# Patient Record
Sex: Female | Born: 1970 | Race: Black or African American | Hispanic: No | Marital: Single | Smoking: Former smoker
Health system: Southern US, Community
[De-identification: ages and names within clinical notes are randomized; demographics above are authoritative.]

## PROBLEM LIST (undated history)

## (undated) DIAGNOSIS — G43909 Migraine, unspecified, not intractable, without status migrainosus: Secondary | ICD-10-CM

## (undated) DIAGNOSIS — I10 Essential (primary) hypertension: Secondary | ICD-10-CM

## (undated) HISTORY — PX: ABDOMINAL HYSTERECTOMY: SHX81

## (undated) HISTORY — PX: HERNIA REPAIR: SHX51

---

## 2002-05-29 ENCOUNTER — Encounter: Payer: Self-pay | Admitting: Emergency Medicine

## 2002-05-29 ENCOUNTER — Emergency Department (HOSPITAL_COMMUNITY): Admission: EM | Admit: 2002-05-29 | Discharge: 2002-05-29 | Payer: Self-pay | Admitting: Emergency Medicine

## 2002-08-27 ENCOUNTER — Ambulatory Visit (HOSPITAL_COMMUNITY): Admission: RE | Admit: 2002-08-27 | Discharge: 2002-08-27 | Payer: Self-pay | Admitting: *Deleted

## 2002-09-04 ENCOUNTER — Encounter: Payer: Self-pay | Admitting: *Deleted

## 2002-09-04 ENCOUNTER — Ambulatory Visit (HOSPITAL_COMMUNITY): Admission: RE | Admit: 2002-09-04 | Discharge: 2002-09-04 | Payer: Self-pay | Admitting: *Deleted

## 2002-12-18 ENCOUNTER — Ambulatory Visit (HOSPITAL_COMMUNITY): Admission: RE | Admit: 2002-12-18 | Discharge: 2002-12-18 | Payer: Self-pay | Admitting: General Surgery

## 2002-12-26 ENCOUNTER — Ambulatory Visit (HOSPITAL_COMMUNITY): Admission: RE | Admit: 2002-12-26 | Discharge: 2002-12-26 | Payer: Self-pay | Admitting: *Deleted

## 2003-03-14 ENCOUNTER — Inpatient Hospital Stay (HOSPITAL_COMMUNITY): Admission: RE | Admit: 2003-03-14 | Discharge: 2003-03-16 | Payer: Self-pay | Admitting: General Surgery

## 2003-05-05 ENCOUNTER — Ambulatory Visit (HOSPITAL_COMMUNITY): Admission: RE | Admit: 2003-05-05 | Discharge: 2003-05-05 | Payer: Self-pay | Admitting: General Surgery

## 2003-12-22 ENCOUNTER — Ambulatory Visit: Payer: Self-pay | Admitting: Nurse Practitioner

## 2004-03-19 ENCOUNTER — Emergency Department (HOSPITAL_COMMUNITY): Admission: EM | Admit: 2004-03-19 | Discharge: 2004-03-19 | Payer: Self-pay | Admitting: Emergency Medicine

## 2004-12-04 ENCOUNTER — Emergency Department (HOSPITAL_COMMUNITY): Admission: EM | Admit: 2004-12-04 | Discharge: 2004-12-04 | Payer: Self-pay | Admitting: Family Medicine

## 2004-12-22 ENCOUNTER — Emergency Department (HOSPITAL_COMMUNITY): Admission: EM | Admit: 2004-12-22 | Discharge: 2004-12-23 | Payer: Self-pay | Admitting: Emergency Medicine

## 2005-12-20 ENCOUNTER — Ambulatory Visit: Payer: Self-pay | Admitting: *Deleted

## 2005-12-20 ENCOUNTER — Ambulatory Visit: Payer: Self-pay | Admitting: Nurse Practitioner

## 2006-03-08 ENCOUNTER — Ambulatory Visit: Payer: Self-pay | Admitting: Nurse Practitioner

## 2006-04-05 ENCOUNTER — Ambulatory Visit: Payer: Self-pay | Admitting: Nurse Practitioner

## 2006-04-26 ENCOUNTER — Ambulatory Visit: Payer: Self-pay | Admitting: Nurse Practitioner

## 2006-05-02 ENCOUNTER — Ambulatory Visit (HOSPITAL_COMMUNITY): Admission: RE | Admit: 2006-05-02 | Discharge: 2006-05-02 | Payer: Self-pay | Admitting: Nurse Practitioner

## 2006-05-10 ENCOUNTER — Ambulatory Visit: Payer: Self-pay | Admitting: Nurse Practitioner

## 2006-05-12 ENCOUNTER — Ambulatory Visit (HOSPITAL_COMMUNITY): Admission: RE | Admit: 2006-05-12 | Discharge: 2006-05-12 | Payer: Self-pay | Admitting: Family Medicine

## 2006-06-09 ENCOUNTER — Ambulatory Visit: Payer: Self-pay | Admitting: Nurse Practitioner

## 2006-07-14 ENCOUNTER — Ambulatory Visit: Payer: Self-pay | Admitting: Nurse Practitioner

## 2006-10-26 ENCOUNTER — Ambulatory Visit: Payer: Self-pay | Admitting: Internal Medicine

## 2006-11-01 ENCOUNTER — Encounter (INDEPENDENT_AMBULATORY_CARE_PROVIDER_SITE_OTHER): Payer: Self-pay | Admitting: *Deleted

## 2007-01-16 ENCOUNTER — Ambulatory Visit: Payer: Self-pay | Admitting: Internal Medicine

## 2007-06-03 ENCOUNTER — Emergency Department (HOSPITAL_COMMUNITY): Admission: EM | Admit: 2007-06-03 | Discharge: 2007-06-03 | Payer: Self-pay | Admitting: Emergency Medicine

## 2007-07-04 ENCOUNTER — Ambulatory Visit: Payer: Self-pay | Admitting: Internal Medicine

## 2007-10-08 ENCOUNTER — Ambulatory Visit: Payer: Self-pay | Admitting: Family Medicine

## 2007-10-09 ENCOUNTER — Encounter: Payer: Self-pay | Admitting: Family Medicine

## 2008-01-15 ENCOUNTER — Emergency Department: Payer: Self-pay

## 2009-05-12 ENCOUNTER — Emergency Department (HOSPITAL_COMMUNITY): Admission: EM | Admit: 2009-05-12 | Discharge: 2009-05-12 | Payer: Self-pay | Admitting: Emergency Medicine

## 2010-01-21 ENCOUNTER — Emergency Department (HOSPITAL_COMMUNITY): Admission: EM | Admit: 2010-01-21 | Discharge: 2009-07-10 | Payer: Self-pay | Admitting: Emergency Medicine

## 2010-03-06 ENCOUNTER — Encounter: Payer: Self-pay | Admitting: General Surgery

## 2010-03-07 ENCOUNTER — Encounter: Payer: Self-pay | Admitting: General Surgery

## 2010-04-09 ENCOUNTER — Other Ambulatory Visit: Payer: Self-pay | Admitting: Family Medicine

## 2010-04-09 DIAGNOSIS — Z1231 Encounter for screening mammogram for malignant neoplasm of breast: Secondary | ICD-10-CM

## 2010-04-28 ENCOUNTER — Ambulatory Visit
Admission: RE | Admit: 2010-04-28 | Discharge: 2010-04-28 | Disposition: A | Discharge status: Home / Self Care | Payer: PRIVATE HEALTH INSURANCE | Source: Ambulatory Visit | Attending: Family Medicine | Admitting: Family Medicine

## 2010-04-28 DIAGNOSIS — Z1231 Encounter for screening mammogram for malignant neoplasm of breast: Secondary | ICD-10-CM

## 2010-04-30 ENCOUNTER — Other Ambulatory Visit: Payer: Self-pay | Admitting: Family Medicine

## 2010-04-30 DIAGNOSIS — R928 Other abnormal and inconclusive findings on diagnostic imaging of breast: Secondary | ICD-10-CM

## 2010-05-07 ENCOUNTER — Ambulatory Visit
Admission: RE | Admit: 2010-05-07 | Discharge: 2010-05-07 | Disposition: A | Discharge status: Home / Self Care | Payer: PRIVATE HEALTH INSURANCE | Source: Ambulatory Visit | Attending: Family Medicine | Admitting: Family Medicine

## 2010-05-07 DIAGNOSIS — R928 Other abnormal and inconclusive findings on diagnostic imaging of breast: Secondary | ICD-10-CM

## 2010-05-09 LAB — BASIC METABOLIC PANEL
BUN: 8 mg/dL (ref 6–23)
Chloride: 104 mEq/L (ref 96–112)
GFR calc non Af Amer: >60 mL/min (ref >60)
Potassium: 4 mEq/L (ref 3.5–5.1)
Sodium: 136 mEq/L (ref 135–145)

## 2010-05-09 LAB — URINALYSIS, ROUTINE W REFLEX MICROSCOPIC
Bilirubin Urine: NEGATIVE
Glucose, UA: NEGATIVE mg/dL
Hgb urine dipstick: NEGATIVE
Specific Gravity, Urine: 1.014 (ref 1.005–1.030)
Urobilinogen, UA: 0.2 mg/dL (ref 0.0–1.0)
pH: 7 (ref 5.0–8.0)

## 2010-05-09 LAB — DIFFERENTIAL
Eosinophils Absolute: 0.2 10*3/uL (ref 0.0–0.7)
Eosinophils Relative: 2 % (ref 0–5)
Lymphocytes Relative: 30 % (ref 12–46)
Lymphs Abs: 2.4 10*3/uL (ref 0.7–4.0)
Monocytes Absolute: 0.3 10*3/uL (ref 0.1–1.0)
Monocytes Relative: 4 % (ref 3–12)

## 2010-05-09 LAB — POCT CARDIAC MARKERS
CKMB, poc: 3.3 ng/mL (ref 1.0–8.0)
Myoglobin, poc: 95.4 ng/mL (ref 12–200)

## 2010-05-09 LAB — CBC
HCT: 42.9 % (ref 36.0–46.0)
Hemoglobin: 14.7 g/dL (ref 12.0–15.0)
MCV: 93.4 fL (ref 78.0–100.0)
WBC: 7.8 10*3/uL (ref 4.0–10.5)

## 2010-05-09 LAB — D-DIMER, QUANTITATIVE: D-Dimer, Quant: 0.22 ug/mL-FEU (ref 0.00–0.48)

## 2010-07-02 NOTE — Op Note (Signed)
NAME:  Mary [name], Mary [name]                        ACCOUNT NO.:  [id number]   MEDICAL RECORD NO.:  [id number]                   PATIENT TYPE:  AMB   LOCATION:  DAY                                  FACILITY:  [facility]   PHYSICIAN:  Angelia Mould. Derrell Lolling, M.D.             DATE OF BIRTH:  1970/04/17   DATE OF PROCEDURE:  03/13/2003  DATE OF DISCHARGE:                                 OPERATIVE REPORT   PREOPERATIVE DIAGNOSIS:  Gastroesophageal reflux disease.   POSTOPERATIVE DIAGNOSIS:  Gastroesophageal reflux disease.   OPERATION:  Laparoscopic Nissen fundoplication.   SURGEON:  Angelia Mould. Derrell Lolling, M.D.   FIRST ASSISTANT:  Sandria Bales. Ezzard Standing, M.D.   INDICATIONS FOR PROCEDURE:  This is a 40 year old black female whose had  problems with bloating, heartburn, and epigastric discomfort and  regurgitation of foul smelling gastric fluid into her mouth for some time.  She has a past history of alcohol and substance abuse but has apparently not  been using for some time now.  She has been taking proton pump inhibitors  twice daily but still has symptoms.  She has had an endoscopy which shows a  small hiatal hernia.  She previously had active gastritis but that seemed to  be resolved at the most recent endoscopy.  She had an upper GI series which  showed normal peristalsis, no hiatal hernia but several episodes of  gastroesophageal reflux.  She has had an ultrasound which shows no  gallstones.  She is brought to the operating room electively for antireflux  surgery.   TECHNIQUE:  Following the induction of general endotracheal anesthesia, the  patient's abdomen was prepped and draped in a sterile fashion.  The stomach  was emptied with an oral gastric tube. A short transverse incision was made  in the left paramedian position about 4-5 cm above the level of the  umbilicus. A 10 mm Optiview trocar was used and the zero degree scope was  inserted. We traversed the abdominal wall noting the abdominal wall  layers  entered the abdominal cavity atraumatically.  Insufflation to 15 mmHg was  performed.  We placed a video camera and surveyed the abdomen and pelvis. We  found that she had fairly extensive adhesions from the dome of the right  lobe of the liver to the right hemidiaphragm, some of these were taken down.  The left lobe of the liver looked normal.  The stomach and duodenum and  gallbladder looked normal. The small intestine and large intestine were  grossly normal.   Two 10 mm trocars were placed in the right upper quadrant.  A 10 mm trocar  was placed in the left subcostal region and a 5 mm trocar was placed in the  epigastric region. Through this 5 mm port, we placed a deformable liver  retractor to retract the left lobe of the liver away. The stomach was placed  on traction.  Using the harmonic scalpel,  we took down the gastrohepatic  omentum.  We then dissected some of the peritoneum off of the anterior  surface of the esophagus and then also over to the left.  We dissected the  right crus away from the esophagus with blunt dissection as well as using  the harmonic scalpel. We identified the posterior vagus nerve. We identified  the left crus as well.   We used the harmonic scalpel to take down the short gastric vessels and  completely freed up the fundus of the stomach.  We had one short gastric  vessel that bled after it was divided. This bleeding was very close to the  spleen and we simply controlled that with Surgicel gauze and pressure for  about five minutes and that was completely hemostatic at the completion of  the case. We really did not lose that much blood.  We completely freed the  fundus off of the left crus.  We placed a Penrose drain around the back of  the esophagus after creating a window. We used this to complete the  dissection and create the window.  We closed the right and left crura of the  diaphragm together with two interrupted sutures of #0 Surgidek. We  then  placed a 50 mm dilator which was a lighted dilator down through the  esophagus into the stomach. This seemed to fill the hiatus and we did not  feel that any further sutures on the crura were necessary.  We brought the  fundus around behind the esophagus from left to right. We then found a  suitable piece of fundus on the left, made sure that these were contiguous  pieces up high and found that we could create a fundoplication without any  tension whatsoever. A traditional Nissen fundoplication  was performed with  three interrupted sutures of #0 Surgidek placed 1 cm apart for about a 2.5  cm wrap.  The first suture was placed in the fundus on the left and then in  the anterior wall of the esophagus just to the right of the vagus nerve and  then in the fundus on the right and then the suture was tied and a metal  clip was placed. The next two sutures were placed in a similar fashion 1 cm  apart each suture taking a bite of the esophagus. Each suture was marked  with a metal clip.  At the completion of the case, everything looked fine.  We inspected the liver and the spleen and there was no bleeding.  We took  photographs of the completed wrap.  The patient tolerated the procedure  well.  At this point, we inspected all the trocar sites and all the trocars  were removed under direct vision.  There was no bleeding from the trocar  sites. The pneumoperitoneum was released. The skin incisions were closed  with subcuticular sutures of 4-0 Vicryl and Steri-Strips.  Clean bandages  were placed and the patient taken to the recovery room in stable condition.  Estimated blood loss was about 20 mL, complications none.  Sponge, needle  and instrument counts were correct.                                               Angelia Mould. Derrell Lolling, M.D.    HMI/MEDQ  D:  03/13/2003  T:  03/13/2003  Job:  [id number]  cc:   Althea Grimmer. Luther Parody, M.D.  [address]  [place]  [place] [address]  Fax:  [phone]   Pablo Lawrence. Philipp Deputy, M.D.  [phone]  [place] [address]  Fax: [phone]

## 2010-07-02 NOTE — Op Note (Signed)
   NAME:  [name], Mary R                        ACCOUNT NO.:  [id number]   MEDICAL RECORD NO.:  [id number]                   PATIENT TYPE:  AMB   LOCATION:  ENDO                                 FACILITY:  [facility]   PHYSICIAN:  Althea Grimmer. Luther Parody, M.D.            DATE OF BIRTH:  1971-02-07   DATE OF PROCEDURE:  DATE OF DISCHARGE:  08/27/2002                                 OPERATIVE REPORT   PROCEDURE:  1. Esophageal manometry.  2. Ambulatory pH probe testing.   INDICATIONS:  Heartburn, chest pain, reflux, cough, and known hiatal hernia  endoscopically in [place].  The patient had a 4 cm hiatal hernia reported.  On this manometric study, the lower esophageal sphincter was noted to be 39-  44 cm from the nares.  The lower esophageal sphincter pressure was normal at  29.7 mmHg.  There was appropriate relaxation with swallows.  The wet  swallows were peristalic with moderate pressures measured at 84-99 mmHg  distally.  Upper esophageal peristaltic measurements similarly were normal.   IMPRESSION:  Essentially normal esophageal manometry study.   AMBULATORY PH PROBE TESTING:  The patient apparently dislodged the pH probe;  thus it was only in place approximately 10 hours.  Nevertheless during this  10 hours, which was mostly upright, the patient had minimal proximal reflux  but had reflux in the distal probe for a total time of 23 minutes or 3.8% of  the time.  This gives her a significant distal channel Johnson/DeMeester of  27 which is significantly elevated over the normal being less than 22.  The  study is somewhat difficult to interpret since the patient was reportedly  taking Nexium and there is no symptom log accompanying these reflux  episodes.   IMPRESSION:  Probably significant distal esophageal reflux.  In light of the  reported hiatal hernia, the essentially normal manometry and the significant  reflux, the patient is likely a candidate for laparoscopic Nissen  fundoplication if her symptoms remain uncontrolled.                                               Althea Grimmer. Luther Parody, M.D.    PJS/MEDQ  D:  08/30/2002  T:  09/01/2002  Job:  [id number]   cc:   Tresa Endo L. Philipp Deputy, M.D.  [phone]  [place] [address]  Fax: [phone]

## 2011-07-14 ENCOUNTER — Emergency Department (HOSPITAL_COMMUNITY)
Admission: EM | Admit: 2011-07-14 | Discharge: 2011-07-14 | Disposition: A | Discharge status: Home / Self Care | Payer: Self-pay | Attending: Emergency Medicine | Admitting: Emergency Medicine

## 2011-07-14 ENCOUNTER — Encounter (HOSPITAL_COMMUNITY): Payer: Self-pay | Admitting: Emergency Medicine

## 2011-07-14 DIAGNOSIS — R05 Cough: Secondary | ICD-10-CM | POA: Insufficient documentation

## 2011-07-14 DIAGNOSIS — R5381 Other malaise: Secondary | ICD-10-CM | POA: Insufficient documentation

## 2011-07-14 DIAGNOSIS — R51 Headache: Secondary | ICD-10-CM | POA: Insufficient documentation

## 2011-07-14 DIAGNOSIS — R059 Cough, unspecified: Secondary | ICD-10-CM | POA: Insufficient documentation

## 2011-07-14 DIAGNOSIS — IMO0001 Reserved for inherently not codable concepts without codable children: Secondary | ICD-10-CM | POA: Insufficient documentation

## 2011-07-14 DIAGNOSIS — J020 Streptococcal pharyngitis: Secondary | ICD-10-CM | POA: Insufficient documentation

## 2011-07-14 DIAGNOSIS — R110 Nausea: Secondary | ICD-10-CM | POA: Insufficient documentation

## 2011-07-14 HISTORY — DX: Migraine, unspecified, not intractable, without status migrainosus: G43.909

## 2011-07-14 LAB — RAPID STREP SCREEN (MED CTR MEBANE ONLY): Streptococcus, Group A Screen (Direct): POSITIVE — AB

## 2011-07-14 MED ORDER — KETOROLAC TROMETHAMINE 60 MG/2ML IM SOLN
60.0000 mg | Freq: Once | INTRAMUSCULAR | Status: AC
  Administered 2011-07-14: 60 mg via INTRAMUSCULAR
  Filled 2011-07-14: qty 2

## 2011-07-14 MED ORDER — HYDROCODONE-ACETAMINOPHEN 7.5-500 MG/15ML PO SOLN: 15.0000 mL | Freq: Four times a day (QID) | ORAL | Status: AC | PRN

## 2011-07-14 MED ORDER — LIDOCAINE VISCOUS 2 % MT SOLN: 20.0000 mL | OROMUCOSAL | Status: AC | PRN

## 2011-07-14 MED ORDER — DEXAMETHASONE SODIUM PHOSPHATE 10 MG/ML IJ SOLN
10.0000 mg | Freq: Once | INTRAMUSCULAR | Status: AC
  Administered 2011-07-14: 10 mg via INTRAMUSCULAR
  Filled 2011-07-14: qty 1

## 2011-07-14 MED ORDER — PENICILLIN G BENZATHINE 1200000 UNIT/2ML IM SUSP
1.2000 10*6.[IU] | Freq: Once | INTRAMUSCULAR | Status: AC
  Administered 2011-07-14: 1.2 10*6.[IU] via INTRAMUSCULAR
  Filled 2011-07-14: qty 2

## 2011-07-14 NOTE — ED Notes (Signed)
Pt. Started sneezing about 5 days ago, then developed sore throat, headache, chest hurting, and nausea.  Pt. Has been in bed for two days.  She has been taking Robitussin, Dayquil, and cough drops.

## 2011-07-14 NOTE — Discharge Instructions (Signed)
Salt Water Gargle  This solution will help make your mouth and throat feel better.  HOME CARE INSTRUCTIONS     Mix 1 teaspoon of salt in 8 ounces of warm water.   Gargle with this solution as much or often as you need or as directed. Swish and gargle gently if you have any sores or wounds in your mouth.   Do not swallow this mixture.  Document Released: 11/05/2003 Document Revised: 01/20/2011 Document Reviewed: 03/28/2008  ExitCare Patient Information 2012 [employer]    Strep Throat  Strep throat is an infection of the throat caused by a bacteria named Streptococcus pyogenes. Your caregiver may call the infection streptococcal "tonsillitis" or "pharyngitis" depending on whether there are signs of inflammation in the tonsils or back of the throat. Strep throat is most common in children from 5 to 15 years old during the cold months of the year, but it can occur in people of any age during any season. This infection is spread from person to person (contagious) through coughing, sneezing, or other close contact.  SYMPTOMS     Fever or chills.   Painful, swollen, red tonsils or throat.   Pain or difficulty when swallowing.   White or yellow spots on the tonsils or throat.   Swollen, tender lymph nodes or "glands" of the neck or under the jaw.   Red rash all over the body (rare).  DIAGNOSIS    Many different infections can cause the same symptoms. A test must be done to confirm the diagnosis so the right treatment can be given. A "rapid strep test" can help your caregiver make the diagnosis in a few minutes. If this test is not available, a light swab of the infected area can be used for a throat culture test. If a throat culture test is done, results are usually available in a day or two.  TREATMENT    Strep throat is treated with antibiotic medicine.  HOME CARE INSTRUCTIONS     Gargle with 1 tsp of salt in 1 cup of warm water, 3 to 4 times per day or as needed for comfort.    Family members who also have a sore throat or fever should be tested for strep throat and treated with antibiotics if they have the strep infection.   Make sure everyone in your household washes their hands well.   Do not share food, drinking cups, or personal items that could cause the infection to spread to others.   You may need to eat a soft food diet until your sore throat gets better.   Drink enough water and fluids to keep your urine clear or pale yellow. This will help prevent dehydration.   Get plenty of rest.   Stay home from school, daycare, or work until you have been on antibiotics for 24 hours.   Only take over-the-counter or prescription medicines for pain, discomfort, or fever as directed by your caregiver.   If antibiotics are prescribed, take them as directed. Finish them even if you start to feel better.  SEEK MEDICAL CARE IF:     The glands in your neck continue to enlarge.   You develop a rash, cough, or earache.   You cough up green, yellow-brown, or bloody sputum.   You have pain or discomfort not controlled by medicines.   Your problems seem to be getting worse rather than better.  SEEK IMMEDIATE MEDICAL CARE IF:     You develop any new symptoms such   as vomiting, severe headache, stiff or painful neck, chest pain, shortness of breath, or trouble swallowing.   You develop severe throat pain, drooling, or changes in your voice.   You develop swelling of the neck, or the skin on the neck becomes red and tender.   You have a fever.   You develop signs of dehydration, such as fatigue, dry mouth, and decreased urination.   You become increasingly sleepy, or you cannot wake up completely.  Document Released: 01/29/2000 Document Revised: 01/20/2011 Document Reviewed: 04/01/2010  [employer] Patient Information 2012 [employer]

## 2011-07-14 NOTE — ED Notes (Signed)
Pt presented to the ER with multiple complaints, states that sx sr=tarted about 5 days ago, pt states it started as "my typical seasonal allergy irritations and went down the hill there after", pt further c/o sneezing, HA, chest discomfort and tightness, sore thorat.

## 2011-07-14 NOTE — ED Provider Notes (Signed)
History     CSN: [id number]  Arrival date & time 07/14/11  1651   First MD Initiated Contact with Patient 07/14/11 1702      5:40 PM HPI Patient reports for 5 days has had multiple symptoms of sore throat, headache, sneezing, coughing, nausea. Reports she's taken multiple over-the-counter medications but nothing is improved her symptoms. Reports painful to swallow. Reports productive cough. Denies chest pain, shortness of breath, neck pain, fevers  Patient is a 41 y.o. female presenting with URI. The history is limited by a developmental delay.  URI The primary symptoms include fatigue, headaches, sore throat, swollen glands, cough, nausea, myalgias and arthralgias. Primary symptoms do not include fever, ear pain, wheezing, abdominal pain, vomiting or rash. The current episode started 3 to 5 days ago. This is a new problem. The problem has not changed since onset. The headache is not associated with neck stiffness.  The swelling is not associated with neck pain.  Symptoms associated with the illness include chills, sinus pressure, congestion and rhinorrhea. The illness is not associated with plugged ear sensation or facial pain. The following treatments were addressed: A decongestant was ineffective.    Past Medical History  Diagnosis Date  . Migraines     Past Surgical History  Procedure Date  . Abdominal hysterectomy   . Hernia repair     No family history on file.  History  Substance Use Topics  . Smoking status: Current Some Day Smoker  . Smokeless tobacco: Not on file  . Alcohol Use: 1.8 oz/week    3 Glasses of wine per week    OB History    Grav Para Term Preterm Abortions TAB SAB Ect Mult Living                  Review of Systems  Constitutional: Positive for chills and fatigue. Negative for fever.  HENT: Positive for congestion, sore throat, rhinorrhea, sneezing, voice change and sinus pressure. Negative for ear pain, facial swelling, neck pain, neck stiffness  and postnasal drip.   Respiratory: Positive for cough. Negative for shortness of breath and wheezing.   Cardiovascular: Negative for chest pain.  Gastrointestinal: Positive for nausea. Negative for vomiting and abdominal pain.  Genitourinary: Negative for dysuria, urgency and frequency.  Musculoskeletal: Positive for myalgias and arthralgias.  Skin: Negative for rash.  Neurological: Positive for headaches.  All other systems reviewed and are negative.    Allergies  Aspirin  Home Medications   Current Outpatient Rx  Name Route Sig Dispense Refill  . IBUPROFEN 200 MG PO TABS Oral Take 400 mg by mouth every 6 (six) hours as needed.    Marland Kitchen ONDANSETRON HCL 4 MG PO TABS Oral Take 4 mg by mouth every 8 (eight) hours as needed. FOR NAUSEA AND VOMITING    . TRAMADOL HCL 50 MG PO TABS Oral Take 50 mg by mouth every 6 (six) hours as needed. PAIN      BP 123/76  Pulse 103  Temp(Src) 98.9 F (37.2 C) (Oral)  Resp 20  Wt 180 lb (81.647 kg)  SpO2 100%  Physical Exam  Vitals reviewed. Constitutional: She is oriented to person, place, and time. Vital signs are normal. She appears well-developed and well-nourished.  HENT:  Head: Normocephalic and atraumatic. No trismus in the jaw.  Right Ear: Tympanic membrane, external ear and ear canal normal.  Left Ear: Tympanic membrane, external ear and ear canal normal.  Nose: Nose normal. No mucosal edema or rhinorrhea. Right sinus  exhibits no maxillary sinus tenderness and no frontal sinus tenderness. Left sinus exhibits no maxillary sinus tenderness and no frontal sinus tenderness.  Mouth/Throat: Uvula is midline and mucous membranes are normal. Normal dentition. No dental abscesses or uvula swelling. Posterior oropharyngeal erythema present. No oropharyngeal exudate or posterior oropharyngeal edema.  Eyes: Conjunctivae and EOM are normal. Pupils are equal, round, and reactive to light. Right eye exhibits no discharge. Left eye exhibits no discharge.    Neck: Trachea normal and normal range of motion. Neck supple. No spinous process tenderness and no muscular tenderness present. No rigidity. Normal range of motion present.  Cardiovascular: Normal rate, regular rhythm and normal heart sounds.  Exam reveals no friction rub.   No murmur heard. Pulmonary/Chest: Effort normal and breath sounds normal. She has no wheezes. She has no rhonchi. She has no rales. She exhibits no tenderness.  Abdominal: Soft. She exhibits no distension. There is no tenderness. There is no rebound and no guarding.  Musculoskeletal: Normal range of motion.  Lymphadenopathy:    She has no cervical adenopathy.  Neurological: She is alert and oriented to person, place, and time. Coordination normal.  Skin: Skin is warm and dry. No rash noted. No erythema. No pallor.    ED Course  Procedures  Results for orders placed during the hospital encounter of 07/14/11  RAPID STREP SCREEN      Component Value Range   Streptococcus, Group A Screen (Direct) POSITIVE (*) NEGATIVE     MDM    Pt is positive for strep pharyngitis. Requests to have IM Bicillin. Will discharge with viscous lidocaine and Lortab elixir for pain. Patient voices understanding and is ready for discharge    Thomasene Lot, Cordelia Poche 07/14/11 1610

## 2011-07-21 NOTE — ED Provider Notes (Signed)
Medical screening examination/treatment/procedure(s) were performed by non-physician practitioner and as supervising physician I was immediately available for consultation/collaboration.  Maaliyah Adolph, MD 07/21/11 0239 

## 2012-02-07 ENCOUNTER — Encounter (HOSPITAL_COMMUNITY): Payer: Self-pay | Admitting: *Deleted

## 2012-02-07 ENCOUNTER — Emergency Department (HOSPITAL_COMMUNITY): Payer: Self-pay

## 2012-02-07 ENCOUNTER — Emergency Department (HOSPITAL_COMMUNITY)
Admission: EM | Admit: 2012-02-07 | Discharge: 2012-02-07 | Disposition: A | Discharge status: Home / Self Care | Payer: Self-pay | Attending: Emergency Medicine | Admitting: Emergency Medicine

## 2012-02-07 DIAGNOSIS — R059 Cough, unspecified: Secondary | ICD-10-CM | POA: Insufficient documentation

## 2012-02-07 DIAGNOSIS — R5381 Other malaise: Secondary | ICD-10-CM | POA: Insufficient documentation

## 2012-02-07 DIAGNOSIS — J029 Acute pharyngitis, unspecified: Secondary | ICD-10-CM | POA: Insufficient documentation

## 2012-02-07 DIAGNOSIS — R509 Fever, unspecified: Secondary | ICD-10-CM | POA: Insufficient documentation

## 2012-02-07 DIAGNOSIS — G43909 Migraine, unspecified, not intractable, without status migrainosus: Secondary | ICD-10-CM | POA: Insufficient documentation

## 2012-02-07 DIAGNOSIS — R51 Headache: Secondary | ICD-10-CM | POA: Insufficient documentation

## 2012-02-07 DIAGNOSIS — IMO0001 Reserved for inherently not codable concepts without codable children: Secondary | ICD-10-CM | POA: Insufficient documentation

## 2012-02-07 DIAGNOSIS — J069 Acute upper respiratory infection, unspecified: Secondary | ICD-10-CM | POA: Insufficient documentation

## 2012-02-07 DIAGNOSIS — R05 Cough: Secondary | ICD-10-CM | POA: Insufficient documentation

## 2012-02-07 DIAGNOSIS — R52 Pain, unspecified: Secondary | ICD-10-CM | POA: Insufficient documentation

## 2012-02-07 DIAGNOSIS — F17200 Nicotine dependence, unspecified, uncomplicated: Secondary | ICD-10-CM | POA: Insufficient documentation

## 2012-02-07 DIAGNOSIS — J3489 Other specified disorders of nose and nasal sinuses: Secondary | ICD-10-CM | POA: Insufficient documentation

## 2012-02-07 DIAGNOSIS — R0602 Shortness of breath: Secondary | ICD-10-CM | POA: Insufficient documentation

## 2012-02-07 MED ORDER — ONDANSETRON 4 MG PO TBDP
ORAL_TABLET | ORAL | Status: AC
  Administered 2012-02-07: 4 mg via ORAL
  Filled 2012-02-07: qty 1

## 2012-02-07 MED ORDER — KETOROLAC TROMETHAMINE 30 MG/ML IJ SOLN
30.0000 mg | Freq: Once | INTRAMUSCULAR | Status: AC
  Administered 2012-02-07: 30 mg via INTRAVENOUS
  Filled 2012-02-07: qty 1

## 2012-02-07 MED ORDER — HYDROCOD POLST-CHLORPHEN POLST 10-8 MG/5ML PO LQCR: 5.0000 mL | Freq: Two times a day (BID) | ORAL | Status: DC

## 2012-02-07 MED ORDER — ONDANSETRON 4 MG PO TBDP: 4.0000 mg | ORAL_TABLET | Freq: Once | ORAL | Status: DC

## 2012-02-07 MED ORDER — SODIUM CHLORIDE 0.9 % IV BOLUS (SEPSIS)
1000.0000 mL | Freq: Once | INTRAVENOUS | Status: AC
  Administered 2012-02-07: 1000 mL via INTRAVENOUS

## 2012-02-07 MED ORDER — BENZONATATE 100 MG PO CAPS: 100.0000 mg | ORAL_CAPSULE | Freq: Three times a day (TID) | ORAL | Status: DC

## 2012-02-07 MED ORDER — ONDANSETRON HCL 4 MG/2ML IJ SOLN
4.0000 mg | Freq: Once | INTRAMUSCULAR | Status: AC
  Administered 2012-02-07: 4 mg via INTRAVENOUS
  Filled 2012-02-07: qty 2

## 2012-02-07 MED ORDER — ONDANSETRON 4 MG PO TBDP
4.0000 mg | ORAL_TABLET | Freq: Once | ORAL | Status: AC
  Administered 2012-02-07: 4 mg via ORAL

## 2012-02-07 MED ORDER — BENZONATATE 100 MG PO CAPS
200.0000 mg | ORAL_CAPSULE | Freq: Once | ORAL | Status: AC
  Administered 2012-02-07: 200 mg via ORAL
  Filled 2012-02-07: qty 2

## 2012-02-07 NOTE — ED Provider Notes (Signed)
Medical screening examination/treatment/procedure(s) were performed by non-physician practitioner and as supervising physician I was immediately available for consultation/collaboration.   Kasaundra Fahrney, MD 02/07/12 0649 

## 2012-02-07 NOTE — ED Provider Notes (Signed)
History     CSN: [id number]  Arrival date & time 02/07/12  0016   First MD Initiated Contact with Patient 02/07/12 [phone]      Chief Complaint  Patient presents with  . Nausea  . Emesis  . Migraine  . Shortness of Breath    chest tightness.   HPI   history provided by the patient. Patient is a 41 year old female with prior history of migraines who presents with complaints of worsening cough, congestion and body aches symptoms. Patient reports starting to feel well over the past 3-4 days. Symptoms began to feel much worse yesterday with increasing cough and body aches. Patient also reports having increasing headache symptoms with sore throat from coughing. Symptoms have also been associated with some subjective fevers and chills and increased fatigue. Patient reports having one episode of nausea and vomiting. She denies any additional episodes and denies any diarrhea symptoms. Patient did use some TheraFlu medications last evening without any significant change. She reports having several family members who have been sick recently. Denies any other known sick contacts. Patient did not receive a flu shot this year.   Past Medical History  Diagnosis Date  . Migraines     Past Surgical History  Procedure Date  . Abdominal hysterectomy   . Hernia repair     History reviewed. No pertinent family history.  History  Substance Use Topics  . Smoking status: Current Some Day Smoker  . Smokeless tobacco: Not on file  . Alcohol Use: 1.8 oz/week    3 Glasses of wine per week    OB History    Grav Para Term Preterm Abortions TAB SAB Ect Mult Living                  Review of Systems  Constitutional: Positive for fever, chills and fatigue.  HENT: Positive for congestion and sore throat.   Respiratory: Positive for cough.   Cardiovascular: Negative for chest pain.  Gastrointestinal: Positive for nausea and vomiting. Negative for abdominal pain, diarrhea and constipation.   Musculoskeletal: Positive for myalgias.  Neurological: Positive for headaches.  All other systems reviewed and are negative.    Allergies  Aspirin  Home Medications   Current Outpatient Rx  Name  Route  Sig  Dispense  Refill  . IBUPROFEN 200 MG PO TABS   Oral   Take 400 mg by mouth every 6 (six) hours as needed.           BP 119/87  Pulse 73  Temp 99.5 F (37.5 C) (Oral)  Resp 16  SpO2 99%  Physical Exam  Nursing note and vitals reviewed. Constitutional: She is oriented to person, place, and time. She appears well-developed and well-nourished. No distress.  HENT:  Head: Normocephalic.  Right Ear: Tympanic membrane normal.  Left Ear: Tympanic membrane normal.  Mouth/Throat: Oropharynx is clear and moist.  Cardiovascular: Normal rate and regular rhythm.   Pulmonary/Chest: Effort normal and breath sounds normal. No respiratory distress. She has no wheezes. She has no rales.  Abdominal: Soft. There is no tenderness. There is no rebound and no guarding.  Musculoskeletal: Normal range of motion. She exhibits no edema and no tenderness.  Neurological: She is alert and oriented to person, place, and time.  Skin: Skin is warm and dry. No rash noted.  Psychiatric: She has a normal mood and affect. Her behavior is normal.    ED Course  Procedures      Dg Chest 2 View  02/07/2012  *RADIOLOGY REPORT*  Clinical Data: Shortness of breath, cough, chest pain  CHEST - 2 VIEW  Comparison: 05/12/2009  Findings: Lungs are clear. No pleural effusion or pneumothorax.  Cardiomediastinal silhouette is within normal limits.  Mild degenerative changes of the visualized thoracolumbar spine.  IMPRESSION: No evidence of acute cardiopulmonary disease.   Original Report Authenticated By: Charline Bills, M.D.      1. URI (upper respiratory infection)       MDM  2:35 AM patient seen and evaluated. Patient is well-appearing in no acute distress. Does not appear severely ill or toxic.  Normal respirations and O2 sats.        Angus Seller, [place] 02/07/12 [phone]

## 2012-02-07 NOTE — ED Notes (Signed)
Patient is alert and oriented x3.  She is complaining of shortness of breath with a migraine . She is also complaining of nausea and vomiting.  Currently she rates her pain 10 of 10.

## 2012-03-23 ENCOUNTER — Encounter (HOSPITAL_COMMUNITY): Payer: Self-pay

## 2012-03-23 ENCOUNTER — Emergency Department (HOSPITAL_COMMUNITY)
Admission: EM | Admit: 2012-03-23 | Discharge: 2012-03-23 | Disposition: A | Discharge status: Home / Self Care | Payer: Self-pay | Attending: Emergency Medicine | Admitting: Emergency Medicine

## 2012-03-23 DIAGNOSIS — Z90710 Acquired absence of both cervix and uterus: Secondary | ICD-10-CM | POA: Insufficient documentation

## 2012-03-23 DIAGNOSIS — R197 Diarrhea, unspecified: Secondary | ICD-10-CM | POA: Insufficient documentation

## 2012-03-23 DIAGNOSIS — R109 Unspecified abdominal pain: Secondary | ICD-10-CM | POA: Insufficient documentation

## 2012-03-23 DIAGNOSIS — Z8679 Personal history of other diseases of the circulatory system: Secondary | ICD-10-CM | POA: Insufficient documentation

## 2012-03-23 DIAGNOSIS — F17200 Nicotine dependence, unspecified, uncomplicated: Secondary | ICD-10-CM | POA: Insufficient documentation

## 2012-03-23 DIAGNOSIS — R112 Nausea with vomiting, unspecified: Secondary | ICD-10-CM | POA: Insufficient documentation

## 2012-03-23 LAB — CBC WITH DIFFERENTIAL/PLATELET
Basophils Relative: 0 % (ref 0–1)
Eosinophils Relative: 1 % (ref 0–5)
HCT: 41.1 % (ref 36.0–46.0)
Hemoglobin: 14.3 g/dL (ref 12.0–15.0)
Lymphocytes Relative: 8 % — ABNORMAL LOW (ref 12–46)
MCHC: 34.8 g/dL (ref 30.0–36.0)
MCV: 94.1 fL (ref 78.0–100.0)
Monocytes Absolute: 0.2 10*3/uL (ref 0.1–1.0)
Monocytes Relative: 2 % — ABNORMAL LOW (ref 3–12)
Neutro Abs: 8.2 10*3/uL — ABNORMAL HIGH (ref 1.7–7.7)

## 2012-03-23 LAB — URINALYSIS, ROUTINE W REFLEX MICROSCOPIC
Bilirubin Urine: NEGATIVE
Hgb urine dipstick: NEGATIVE
Specific Gravity, Urine: 1.031 — ABNORMAL HIGH (ref 1.005–1.030)
pH: 5.5 (ref 5.0–8.0)

## 2012-03-23 LAB — BASIC METABOLIC PANEL
BUN: 12 mg/dL (ref 6–23)
CO2: 23 mEq/L (ref 19–32)
Chloride: 102 mEq/L (ref 96–112)
Creatinine, Ser: 0.86 mg/dL (ref 0.50–1.10)

## 2012-03-23 MED ORDER — PROMETHAZINE HCL 25 MG PO TABS: 25.0000 mg | ORAL_TABLET | Freq: Four times a day (QID) | ORAL | Status: DC | PRN

## 2012-03-23 MED ORDER — SODIUM CHLORIDE 0.9 % IV BOLUS (SEPSIS)
1000.0000 mL | Freq: Once | INTRAVENOUS | Status: AC
  Administered 2012-03-23: 1000 mL via INTRAVENOUS

## 2012-03-23 MED ORDER — DICYCLOMINE HCL 10 MG PO CAPS
10.0000 mg | ORAL_CAPSULE | Freq: Once | ORAL | Status: AC
  Administered 2012-03-23: 10 mg via ORAL
  Filled 2012-03-23: qty 1

## 2012-03-23 MED ORDER — DICYCLOMINE HCL 20 MG PO TABS: 20.0000 mg | ORAL_TABLET | Freq: Two times a day (BID) | ORAL | Status: DC

## 2012-03-23 MED ORDER — ONDANSETRON HCL 4 MG/2ML IJ SOLN
4.0000 mg | Freq: Once | INTRAMUSCULAR | Status: AC
  Administered 2012-03-23: 4 mg via INTRAVENOUS
  Filled 2012-03-23: qty 2

## 2012-03-23 NOTE — ED Notes (Signed)
Pt given a cup of ice with Sprite

## 2012-03-23 NOTE — ED Notes (Signed)
Pt given 3 warm blankets.

## 2012-03-23 NOTE — ED Notes (Signed)
Pt presents with onset of umbilical abdominal pain that woke her from sleep today at 0300.  Pt describes pain as a cramping, reports pain is now generalized.  +nausea, vomiting and diarrhea

## 2012-03-23 NOTE — ED Notes (Signed)
Pt up ambulatory to the bathroom at this time attempting to provide an urine specimen 

## 2012-03-23 NOTE — ED Notes (Signed)
Pt given another cup of ice

## 2012-03-23 NOTE — ED Provider Notes (Signed)
History     CSN: [id number]  Arrival date & time 03/23/12  1326   First MD Initiated Contact with Patient 03/23/12 1535      No chief complaint on file.   (Consider location/radiation/quality/duration/timing/severity/associated sxs/prior treatment) HPI Comments: Patient reports that she woke up this morning with nausea, vomiting, and diarrhea.  She has had two episodes of vomiting and three episodes of diarrhea.  No blood in her emesis or blood in her stool.  She has also had diffuse abdominal cramping pain associated with her symptoms.  She reports that the abdominal pain has been coming in spasms.  She has a history of IBS and reports that her abdominal cramping feels similar to the pain that she has had with IBS.  She reports that today she has had episodes where she has solid bowel movements and also episodes of diarrhea.  She denies fever or chills.  No urinary symptoms.   Abdominal surgeries include a hernia repair and an abdominal hysterectomy.    The history is provided by the patient.    Past Medical History  Diagnosis Date  . Migraines     Past Surgical History  Procedure Date  . Abdominal hysterectomy   . Hernia repair     History reviewed. No pertinent family history.  History  Substance Use Topics  . Smoking status: Current Some Day Smoker  . Smokeless tobacco: Not on file  . Alcohol Use: 1.8 oz/week    3 Glasses of wine per week    OB History    Grav Para Term Preterm Abortions TAB SAB Ect Mult Living                  Review of Systems  Constitutional: Negative for fever and chills.  Gastrointestinal: Positive for nausea, vomiting, abdominal pain and diarrhea. Negative for constipation, blood in stool and abdominal distention.  Genitourinary: Negative for dysuria, urgency and frequency.  All other systems reviewed and are negative.    Allergies  Aspirin  Home Medications  No current outpatient prescriptions on file.  BP 105/63  Pulse 84  Temp  98.4 F (36.9 C) (Oral)  Resp 16  SpO2 100%  Physical Exam  Nursing note and vitals reviewed. Constitutional: She appears well-developed and well-nourished. No distress.  HENT:  Head: Normocephalic and atraumatic.  Mouth/Throat: Oropharynx is clear and moist.  Neck: Normal range of motion. Neck supple.  Cardiovascular: Normal rate, regular rhythm and normal heart sounds.   Pulmonary/Chest: Effort normal and breath sounds normal.  Abdominal: Soft. Normal appearance and bowel sounds are normal. She exhibits no distension and no mass. There is generalized tenderness. There is no rebound and no guarding.  Musculoskeletal: Normal range of motion.  Neurological: She is alert.  Skin: Skin is warm and dry. She is not diaphoretic.  Psychiatric: She has a normal mood and affect.    ED Course  Procedures (including critical care time)  Labs Reviewed  CBC WITH DIFFERENTIAL - Abnormal; Notable for the following:    Neutrophils Relative 89 (*)     Neutro Abs 8.2 (*)     Lymphocytes Relative 8 (*)     Monocytes Relative 2 (*)     All other components within normal limits  BASIC METABOLIC PANEL - Abnormal; Notable for the following:    Glucose, Bld 101 (*)     GFR calc non Af Amer 82 (*)     All other components within normal limits  LIPASE, BLOOD  URINALYSIS, ROUTINE  W REFLEX MICROSCOPIC   No results found.   No diagnosis found.  5:27 PM Reassessed patient.  She reports that her abdominal pain has improved somewhat.  Nausea has also improved somewhat.  Patient currently receiving IVF.  Will fluid challenge and reassess.  6:00 PM Reassessed patient.  She reports that her pain and nausea have improved.  Patient able to tolerate PO liquids.  MDM  Patient presenting with nausea, vomiting, and diarrhea along with diffuse cramping abdominal pain.  No rebound or guarding on physical exam.  Patient afebrile.  Labs unremarkable.  Patient given Zofran and symptoms improved.  She was able to  tolerate PO liquids.  Patient discharged home.  Return precautions discussed.        Pascal Lux [place], PA-C 03/24/12 1627

## 2012-03-24 NOTE — ED Provider Notes (Signed)
  Medical screening examination/treatment/procedure(s) were performed by non-physician practitioner and as supervising physician I was immediately available for consultation/collaboration.    Gerhard Munch, MD 03/24/12 2329

## 2012-05-23 ENCOUNTER — Encounter (HOSPITAL_COMMUNITY): Payer: Self-pay | Admitting: Family Medicine

## 2012-05-23 ENCOUNTER — Inpatient Hospital Stay (HOSPITAL_COMMUNITY)
Admission: EM | Admit: 2012-05-23 | Discharge: 2012-05-27 | DRG: 392 | Disposition: A | Discharge status: Home / Self Care | Payer: MEDICAID | Attending: Internal Medicine | Admitting: Internal Medicine

## 2012-05-23 ENCOUNTER — Observation Stay (HOSPITAL_COMMUNITY): Payer: Self-pay

## 2012-05-23 DIAGNOSIS — K589 Irritable bowel syndrome without diarrhea: Secondary | ICD-10-CM | POA: Diagnosis present

## 2012-05-23 DIAGNOSIS — K625 Hemorrhage of anus and rectum: Secondary | ICD-10-CM | POA: Diagnosis present

## 2012-05-23 DIAGNOSIS — E860 Dehydration: Secondary | ICD-10-CM | POA: Diagnosis present

## 2012-05-23 DIAGNOSIS — K7689 Other specified diseases of liver: Secondary | ICD-10-CM | POA: Diagnosis present

## 2012-05-23 DIAGNOSIS — F17200 Nicotine dependence, unspecified, uncomplicated: Secondary | ICD-10-CM | POA: Diagnosis present

## 2012-05-23 DIAGNOSIS — R112 Nausea with vomiting, unspecified: Secondary | ICD-10-CM

## 2012-05-23 DIAGNOSIS — R109 Unspecified abdominal pain: Secondary | ICD-10-CM | POA: Diagnosis present

## 2012-05-23 DIAGNOSIS — K5289 Other specified noninfective gastroenteritis and colitis: Principal | ICD-10-CM | POA: Diagnosis present

## 2012-05-23 DIAGNOSIS — R52 Pain, unspecified: Secondary | ICD-10-CM

## 2012-05-23 LAB — CBC
Hemoglobin: 14.4 g/dL (ref 12.0–15.0)
MCH: 32.1 pg (ref 26.0–34.0)
RBC: 4.49 MIL/uL (ref 3.87–5.11)
WBC: 10 10*3/uL (ref 4.0–10.5)

## 2012-05-23 LAB — COMPREHENSIVE METABOLIC PANEL
ALT: 22 U/L (ref 0–35)
AST: 24 U/L (ref 0–37)
Alkaline Phosphatase: 85 U/L (ref 39–117)
CO2: 28 mEq/L (ref 19–32)
Calcium: 10.5 mg/dL (ref 8.4–10.5)
Chloride: 99 mEq/L (ref 96–112)
GFR calc Af Amer: 88 mL/min — ABNORMAL LOW (ref >90)
GFR calc non Af Amer: 76 mL/min — ABNORMAL LOW (ref >90)
Glucose, Bld: 92 mg/dL (ref 70–99)
Sodium: 136 mEq/L (ref 135–145)
Total Bilirubin: 0.6 mg/dL (ref 0.3–1.2)

## 2012-05-23 LAB — SEDIMENTATION RATE: Sed Rate: 4 mm/hr (ref 0–22)

## 2012-05-23 LAB — TYPE AND SCREEN

## 2012-05-23 LAB — LACTIC ACID, PLASMA: Lactic Acid, Venous: 1.2 mmol/L (ref 0.5–2.2)

## 2012-05-23 MED ORDER — ACETAMINOPHEN 325 MG PO TABS: 650.0000 mg | ORAL_TABLET | Freq: Four times a day (QID) | ORAL | Status: DC | PRN

## 2012-05-23 MED ORDER — ONDANSETRON HCL 4 MG/2ML IJ SOLN
4.0000 mg | Freq: Four times a day (QID) | INTRAMUSCULAR | Status: DC | PRN
  Administered 2012-05-25: 4 mg via INTRAVENOUS
  Filled 2012-05-23: qty 2

## 2012-05-23 MED ORDER — HYDROMORPHONE HCL PF 1 MG/ML IJ SOLN
1.0000 mg | Freq: Once | INTRAMUSCULAR | Status: AC
  Administered 2012-05-23: 1 mg via INTRAVENOUS
  Filled 2012-05-23: qty 1

## 2012-05-23 MED ORDER — ALUM & MAG HYDROXIDE-SIMETH 200-200-20 MG/5ML PO SUSP: 30.0000 mL | Freq: Four times a day (QID) | ORAL | Status: DC | PRN

## 2012-05-23 MED ORDER — IOHEXOL 300 MG/ML  SOLN
50.0000 mL | Freq: Once | INTRAMUSCULAR | Status: AC | PRN
  Administered 2012-05-23: 50 mL via ORAL

## 2012-05-23 MED ORDER — PANTOPRAZOLE SODIUM 40 MG IV SOLR
40.0000 mg | Freq: Two times a day (BID) | INTRAVENOUS | Status: DC
  Administered 2012-05-23 – 2012-05-26 (×6): 40 mg via INTRAVENOUS
  Filled 2012-05-23 (×7): qty 40

## 2012-05-23 MED ORDER — ONDANSETRON HCL 4 MG PO TABS
4.0000 mg | ORAL_TABLET | Freq: Four times a day (QID) | ORAL | Status: DC | PRN
  Administered 2012-05-26 – 2012-05-27 (×2): 4 mg via ORAL
  Filled 2012-05-23 (×2): qty 1

## 2012-05-23 MED ORDER — ONDANSETRON HCL 4 MG/2ML IJ SOLN
4.0000 mg | Freq: Once | INTRAMUSCULAR | Status: AC
  Administered 2012-05-23: 4 mg via INTRAVENOUS
  Filled 2012-05-23: qty 2

## 2012-05-23 MED ORDER — SODIUM CHLORIDE 0.9 % IV SOLN
INTRAVENOUS | Status: DC
  Administered 2012-05-23 – 2012-05-24 (×3): via INTRAVENOUS

## 2012-05-23 MED ORDER — PANTOPRAZOLE SODIUM 40 MG IV SOLR
40.0000 mg | Freq: Once | INTRAVENOUS | Status: AC
  Administered 2012-05-23: 40 mg via INTRAVENOUS
  Filled 2012-05-23: qty 40

## 2012-05-23 MED ORDER — METOCLOPRAMIDE HCL 5 MG/ML IJ SOLN
10.0000 mg | Freq: Once | INTRAMUSCULAR | Status: AC
  Administered 2012-05-23: 10 mg via INTRAVENOUS
  Filled 2012-05-23: qty 2

## 2012-05-23 MED ORDER — SODIUM CHLORIDE 0.9 % IJ SOLN
3.0000 mL | Freq: Two times a day (BID) | INTRAMUSCULAR | Status: DC
  Administered 2012-05-23 – 2012-05-26 (×4): 3 mL via INTRAVENOUS

## 2012-05-23 MED ORDER — SODIUM CHLORIDE 0.9 % IV SOLN
INTRAVENOUS | Status: DC
  Administered 2012-05-23: 19:00:00 via INTRAVENOUS

## 2012-05-23 MED ORDER — HYDROCODONE-ACETAMINOPHEN 5-325 MG PO TABS
1.0000 | ORAL_TABLET | ORAL | Status: DC | PRN
  Administered 2012-05-23: 1 via ORAL
  Administered 2012-05-24 – 2012-05-26 (×4): 2 via ORAL
  Filled 2012-05-23 (×6): qty 2

## 2012-05-23 MED ORDER — ACETAMINOPHEN 650 MG RE SUPP: 650.0000 mg | Freq: Four times a day (QID) | RECTAL | Status: DC | PRN

## 2012-05-23 MED ORDER — IOHEXOL 300 MG/ML  SOLN
100.0000 mL | Freq: Once | INTRAMUSCULAR | Status: AC | PRN
  Administered 2012-05-23: 100 mL via INTRAVENOUS

## 2012-05-23 MED ORDER — HYDROMORPHONE HCL PF 1 MG/ML IJ SOLN
1.0000 mg | INTRAMUSCULAR | Status: DC | PRN
  Administered 2012-05-24 – 2012-05-26 (×7): 1 mg via INTRAVENOUS
  Filled 2012-05-23 (×8): qty 1

## 2012-05-23 NOTE — ED Notes (Signed)
Per pt sts after BM yesterday she had some bleeding and tissues. sts since she has been leaking out blood, abdominal pain and HA

## 2012-05-23 NOTE — ED Notes (Signed)
Pt returned from CT °

## 2012-05-23 NOTE — ED Notes (Signed)
Attempted to gain IV access, unsuccessful X2. Paged IV team.

## 2012-05-23 NOTE — ED Notes (Signed)
Pt finished drinking 1st cup of oral contrast, CT notified. 

## 2012-05-23 NOTE — H&P (Signed)
History and Physical       Hospital Admission Note Date: 05/23/2012  Patient name: Mary [name] Medical record number: [id number] Date of birth: [birth year] Age: 42 y.o. Gender: female PCP: Default, Provider, MD    Chief Complaint:  Crampy abdominal pain with the nausea, vomiting and rectal bleeding since last night  HPI: Patient is a 42 year old female with a history of hiatal hernia status post repair, possible IBS who presented with rectal bleeding that started with crampy abdominal pain last night. Patient states that she had diffuse abdominal pain, crampy in nature last night and in when she used the toilet she had bright red blood. She also has nausea and vomiting and unable to hold anything down. She had 7 episodes of rectal bleeding since last night. She feels generally weak but no dizziness lightheadedness syncope any chest pain.  Patient also states that she usually has crampy abdominal pain before having bowel movement for last several years but no prior bleeding. She denies any NSAID use.   Review of Systems:  Constitutional: Denies fever, chills, diaphoresis, appetite change and fatigue.  HEENT: Denies photophobia, eye pain, redness, hearing loss, ear pain, congestion, sore throat, rhinorrhea, sneezing, mouth sores, trouble swallowing, neck pain, neck stiffness and tinnitus.   Respiratory: Denies SOB, DOE, cough, chest tightness,  and wheezing.   Cardiovascular: Denies chest pain, palpitations and leg swelling.  Gastrointestinal: Please see history of present illness  Genitourinary: Denies dysuria, urgency, frequency, hematuria, flank pain and difficulty urinating.  Musculoskeletal: Denies myalgias, back pain, joint swelling, arthralgias and gait problem.  Skin: Denies pallor, rash and wound.  Neurological: Denies dizziness, seizures, syncope,  light-headedness, numbness and headaches, generalized weakness.  Hematological:  Denies adenopathy. Easy bruising, personal or family bleeding history  Psychiatric/Behavioral: Denies suicidal ideation, mood changes, confusion, nervousness, sleep disturbance and agitation  Past Medical History: Past Medical History  Diagnosis Date  . Migraines    Past Surgical History  Procedure Laterality Date  . Abdominal hysterectomy    . Hernia repair      Medications: Prior to Admission medications   Not on File    Allergies:   Allergies  Allergen Reactions  . Aspirin Hives    Social History:  reports that she has been smoking.  She does not have any smokeless tobacco history on file. She reports that she drinks about 1.8 ounces of alcohol per week. Her drug history is not on file.  Family History: History reviewed. No pertinent family history.  Physical Exam: Blood pressure 106/72, pulse 57, temperature 98 F (36.7 C), temperature source Oral, resp. rate 20, SpO2 100.00%. General: Alert, awake, oriented x3, in no acute distress. HEENT: normocephalic, atraumatic, anicteric sclera, pink conjunctiva, pupils equal and reactive to light and accomodation, oropharynx clear Neck: supple, no masses or lymphadenopathy, no goiter, no bruits  Heart: Regular rate and rhythm, without murmurs, rubs or gallops. Lungs: Clear to auscultation bilaterally, no wheezing, rales or rhonchi. Abdomen: Mild Diffuse abdominal tenderness, soft no rebound or guarding, normal bowel sounds  Extremities: No clubbing, cyanosis or edema with positive pedal pulses. Neuro: Grossly intact, no focal neurological deficits, strength 5/5 upper and lower extremities bilaterally Psych: alert and oriented x 3, normal mood and affect Skin: no rashes or lesions, warm and dry   LABS on Admission:  Basic Metabolic Panel:  Recent Labs Lab 05/23/12 1611  NA 136  K 4.2  CL 99  CO2 28  GLUCOSE 92  BUN 9  CREATININE 0.91  CALCIUM 10.5  Liver Function Tests:  Recent Labs Lab 05/23/12 1611  AST  24  ALT 22  ALKPHOS 85  BILITOT 0.6  PROT 8.3  ALBUMIN 4.5   No results found for this basename: LIPASE, AMYLASE,  in the last 168 hours No results found for this basename: AMMONIA,  in the last 168 hours CBC:  Recent Labs Lab 05/23/12 1611  WBC 10.0  HGB 14.4  HCT 40.8  MCV 90.9  PLT 274     Radiological Exams on Admission: No results found.  Assessment/Plan Principal Problem:   Abdominal pain, acute with rectal bleeding, nausea and vomiting - Admit to telemetry, placed on IV fluid hydration, clears for now, antiemetics, pain control, IV protonix - Stat CT abdomen and pelvis to rule out any colitis (infectious versus inflammatory). If patient has any infectious colitis, will place on IV antibiotics. Obtain ESR, lactic acid. -  EDP (Dr Rosalia Hammers) to call GI consult  Active Problems:    Dehydration - Continue IV fluid hydration  DVT prophylaxis: SCDs  CODE STATUS: Full code  Further plan will depend as patient's clinical course evolves and further radiologic and laboratory data become available.   Time Spent on Admission: 1 hour  Umeka Wrench M.D. [facility] 05/23/2012, 7:17 PM Pager: [phone]  If 7PM-7AM, please contact night-coverage [email or web address] Password [email or web address]

## 2012-05-23 NOTE — ED Provider Notes (Addendum)
History     CSN: [id number]  Arrival date & time 05/23/12  1537   First MD Initiated Contact with Patient 05/23/12 1604      Chief Complaint  Patient presents with  . Rectal Bleeding    (Consider location/radiation/quality/duration/timing/severity/associated sxs/prior treatment) HPI Patient complaining of rectal bleeding today multiple episodes of loose stool described as bloody.  She has some diffuse crampy abdominal pain which she identifies as like her ibs.  She denies any history of rectal bleeding, ulcers, aspirin, nsaid use.  She has had a hiatal hernia repair and hysterectomy.  She feels generally weak but denies syncope, chest pain, or sob.  She does not currently have primary care.   Past Medical History  Diagnosis Date  . Migraines     Past Surgical History  Procedure Laterality Date  . Abdominal hysterectomy    . Hernia repair      History reviewed. No pertinent family history.  History  Substance Use Topics  . Smoking status: Current Some Day Smoker  . Smokeless tobacco: Not on file  . Alcohol Use: 1.8 oz/week    3 Glasses of wine per week    OB History   Grav Para Term Preterm Abortions TAB SAB Ect Mult Living                  Review of Systems  All other systems reviewed and are negative.    Allergies  Aspirin  Home Medications  No current outpatient prescriptions on file.  BP 106/72  Pulse 57  Temp(Src) 98 F (36.7 C) (Oral)  Resp 20  SpO2 100%  Physical Exam  Nursing note and vitals reviewed. Constitutional: She is oriented to person, place, and time. She appears well-developed and well-nourished.  HENT:  Head: Normocephalic and atraumatic.  Right Ear: External ear normal.  Left Ear: External ear normal.  Nose: Nose normal.  Mouth/Throat: Oropharynx is clear and moist.  Eyes: Conjunctivae and EOM are normal. Pupils are equal, round, and reactive to light.  Neck: Normal range of motion. Neck supple.  Cardiovascular: Normal rate,  regular rhythm, normal heart sounds and intact distal pulses.   Pulmonary/Chest: She is in respiratory distress.  Abdominal: Soft. Bowel sounds are normal. She exhibits no mass. There is no rebound and no guarding.  Mild diffuse tenderness  Genitourinary:  Patient with grossly bloody stool on rectal exam, nontender.   Musculoskeletal: Normal range of motion.  Neurological: She is alert and oriented to person, place, and time. She has normal reflexes.  Skin: Skin is warm and dry. No erythema.  Psychiatric: She has a normal mood and affect. Her behavior is normal. Judgment and thought content normal.    ED Course  Procedures (including critical care time)  Labs Reviewed  COMPREHENSIVE METABOLIC PANEL - Abnormal; Notable for the following:    GFR calc non Af Amer 76 (*)    GFR calc Af Amer 88 (*)    All other components within normal limits  CBC  TYPE AND SCREEN  ABO/RH   No results found.   No diagnosis found.  Results for orders placed during the hospital encounter of 05/23/12  CBC      Result Value Range   WBC 10.0  4.0 - 10.5 K/uL   RBC 4.49  3.87 - 5.11 MIL/uL   Hemoglobin 14.4  12.0 - 15.0 g/dL   HCT 40.9  81.1 - 91.4 %   MCV 90.9  78.0 - 100.0 fL   MCH 32.1  26.0 - 34.0 pg   MCHC 35.3  30.0 - 36.0 g/dL   RDW 16.1  09.6 - 04.5 %   Platelets 274  150 - 400 K/uL  COMPREHENSIVE METABOLIC PANEL      Result Value Range   Sodium 136  135 - 145 mEq/L   Potassium 4.2  3.5 - 5.1 mEq/L   Chloride 99  96 - 112 mEq/L   CO2 28  19 - 32 mEq/L   Glucose, Bld 92  70 - 99 mg/dL   BUN 9  6 - 23 mg/dL   Creatinine, Ser 4.09  0.50 - 1.10 mg/dL   Calcium 81.1  8.4 - 91.4 mg/dL   Total Protein 8.3  6.0 - 8.3 g/dL   Albumin 4.5  3.5 - 5.2 g/dL   AST 24  0 - 37 U/L   ALT 22  0 - 35 U/L   Alkaline Phosphatase 85  39 - 117 U/L   Total Bilirubin 0.6  0.3 - 1.2 mg/dL   GFR calc non Af Amer 76 (*) >90 mL/min   GFR calc Af Amer 88 (*) >90 mL/min  TYPE AND SCREEN      Result Value  Range   ABO/RH(D) O POS     Antibody Screen NEG     Sample Expiration 05/26/2012    ABO/RH      Result Value Range   ABO/RH(D) O POS       MDM  Patient who presents with multiple episodes of rectal bleeding today. She is hemodynamically stable. She has mild diffuse abdominal tenderness. She has a history of IBS. Hemoglobin is normal. Plan admission for rectal bleeding     Hilario Quarry, MD 05/23/12 1829  Discussed with Dr. Isidoro Donning.  Patient to be admitted to tele bed.  Consult to Gi placed.   Hilario Quarry, MD 05/23/12 1901  Discussed with Dr. Andrey Campanile on call for gi and he will see patient in consult.   Hilario Quarry, MD 05/23/12 2000

## 2012-05-23 NOTE — ED Notes (Signed)
Lab at bedside

## 2012-05-23 NOTE — ED Notes (Signed)
Pt reports around midnight her stomach started to hurt, around 3am she started to hear rumbling and got abd cramps. Pt reports she went to use the restroom and she noticed bld in the toliet, pt reports it has gotten lighter since this morning but still continuing to have bld her in stool. Pt reports she is usually constipated and only goes every 7-9 days, pt denies having a rectal bleed before. Pt denies being on any bld thinners. Pt ambulated to room with no issues, in nad, skin warm and dry, resp e/u.

## 2012-05-24 LAB — CBC
HCT: 36.4 % (ref 36.0–46.0)
Hemoglobin: 12.6 g/dL (ref 12.0–15.0)
MCV: 91 fL (ref 78.0–100.0)
RDW: 13.9 % (ref 11.5–15.5)
WBC: 8.5 10*3/uL (ref 4.0–10.5)

## 2012-05-24 LAB — BASIC METABOLIC PANEL
CO2: 26 mEq/L (ref 19–32)
Chloride: 105 mEq/L (ref 96–112)
Creatinine, Ser: 1.01 mg/dL (ref 0.50–1.10)
GFR calc Af Amer: 78 mL/min — ABNORMAL LOW (ref >90)
Potassium: 4.2 mEq/L (ref 3.5–5.1)

## 2012-05-24 MED ORDER — CIPROFLOXACIN IN D5W 400 MG/200ML IV SOLN
400.0000 mg | Freq: Two times a day (BID) | INTRAVENOUS | Status: DC
  Administered 2012-05-24 – 2012-05-25 (×4): 400 mg via INTRAVENOUS
  Filled 2012-05-24 (×5): qty 200

## 2012-05-24 MED ORDER — DIPHENHYDRAMINE HCL 50 MG/ML IJ SOLN
12.5000 mg | Freq: Four times a day (QID) | INTRAMUSCULAR | Status: DC | PRN
  Administered 2012-05-24 – 2012-05-27 (×2): 12.5 mg via INTRAVENOUS
  Filled 2012-05-24 (×2): qty 1

## 2012-05-24 NOTE — Consult Note (Signed)
[facility] Consult Note  Referring Provider: No ref. provider found Primary Care Physician:  Default, Provider, MD Primary Gastroenterologist:  Dr.  Antony Contras Complaint: Bloody diarrhea, nausea and vomiting HPI: Mary [name] is an 42 y.o. black female  who presents with a one-day history of abdominal cramps followed by diarrhea with frank blood in her stool on several occasions. Later in the day she developed nonbloody emesis and came to the emergency room. She states she has had similar episodes of GI distress before occurring about every 2 weeks occasionally with vomiting but always with cramps and diarrhea but never blood. She states she had a colonoscopy about 2004 and has been told that she has irritable bowel syndrome. She also has a history of a Nissen fundoplication. Her abdominal CT scan showed no thickening of the colon are white blood cell count was 10,000. Slightly better this morning.  Past Medical History  Diagnosis Date  . Migraines     Past Surgical History  Procedure Laterality Date  . Abdominal hysterectomy    . Hernia repair      No prescriptions prior to admission    Allergies:  Allergies  Allergen Reactions  . Aspirin Hives    History reviewed. No pertinent family history.  Social History:  reports that she has been smoking.  She does not have any smokeless tobacco history on file. She reports that she drinks about 1.8 ounces of alcohol per week. Her drug history is not on file.  Review of Systems: negative except as above   Blood pressure 105/69, pulse 65, temperature 97.9 F (36.6 C), temperature source Oral, resp. rate 18, height 5\' 4"  (1.626 m), weight 80.5 kg (177 lb 7.5 oz), SpO2 98.00%. Head: Normocephalic, without obvious abnormality, atraumatic Neck: no adenopathy, no carotid bruit, no JVD, supple, symmetrical, trachea midline and thyroid not enlarged, symmetric, no tenderness/mass/nodules Resp: clear to auscultation  bilaterally Cardio: regular rate and rhythm, S1, S2 normal, no murmur, click, rub or gallop GI: Abdomen soft, moderately tender nondistended  Extremities: extremities normal, atraumatic, no cyanosis or edema  Results for orders placed during the hospital encounter of 05/23/12 (from the past 48 hour(s))  CBC     Status: None   Collection Time    05/23/12  4:11 PM      Result Value Range   WBC 10.0  4.0 - 10.5 K/uL   RBC 4.49  3.87 - 5.11 MIL/uL   Hemoglobin 14.4  12.0 - 15.0 g/dL   HCT 16.1  09.6 - 04.5 %   MCV 90.9  78.0 - 100.0 fL   MCH 32.1  26.0 - 34.0 pg   MCHC 35.3  30.0 - 36.0 g/dL   RDW 40.9  81.1 - 91.4 %   Platelets 274  150 - 400 K/uL  COMPREHENSIVE METABOLIC PANEL     Status: Abnormal   Collection Time    05/23/12  4:11 PM      Result Value Range   Sodium 136  135 - 145 mEq/L   Potassium 4.2  3.5 - 5.1 mEq/L   Chloride 99  96 - 112 mEq/L   CO2 28  19 - 32 mEq/L   Glucose, Bld 92  70 - 99 mg/dL   BUN 9  6 - 23 mg/dL   Creatinine, Ser 7.82  0.50 - 1.10 mg/dL   Calcium 95.6  8.4 - 21.3 mg/dL   Total Protein 8.3  6.0 - 8.3 g/dL   Albumin 4.5  3.5 - 5.2  g/dL   AST 24  0 - 37 U/L   ALT 22  0 - 35 U/L   Alkaline Phosphatase 85  39 - 117 U/L   Total Bilirubin 0.6  0.3 - 1.2 mg/dL   GFR calc non Af Amer 76 (*) >90 mL/min   GFR calc Af Amer 88 (*) >90 mL/min   Comment:            The eGFR has been calculated     using the CKD EPI equation.     This calculation has not been     validated in all clinical     situations.     eGFR's persistently     <90 mL/min signify     possible Chronic Kidney Disease.  TYPE AND SCREEN     Status: None   Collection Time    05/23/12  4:30 PM      Result Value Range   ABO/RH(D) O POS     Antibody Screen NEG     Sample Expiration 05/26/2012    ABO/RH     Status: None   Collection Time    05/23/12  4:30 PM      Result Value Range   ABO/RH(D) O POS    LACTIC ACID, PLASMA     Status: None   Collection Time    05/23/12  7:54 PM       Result Value Range   Lactic Acid, Venous 1.2  0.5 - 2.2 mmol/L  SEDIMENTATION RATE     Status: None   Collection Time    05/23/12  7:55 PM      Result Value Range   Sed Rate 4  0 - 22 mm/hr  BASIC METABOLIC PANEL     Status: Abnormal   Collection Time    05/24/12  5:15 AM      Result Value Range   Sodium 137  135 - 145 mEq/L   Potassium 4.2  3.5 - 5.1 mEq/L   Chloride 105  96 - 112 mEq/L   CO2 26  19 - 32 mEq/L   Glucose, Bld 108 (*) 70 - 99 mg/dL   BUN 6  6 - 23 mg/dL   Creatinine, Ser 4.03  0.50 - 1.10 mg/dL   Calcium 9.2  8.4 - 47.4 mg/dL   GFR calc non Af Amer 67 (*) >90 mL/min   GFR calc Af Amer 78 (*) >90 mL/min   Comment:            The eGFR has been calculated     using the CKD EPI equation.     This calculation has not been     validated in all clinical     situations.     eGFR's persistently     <90 mL/min signify     possible Chronic Kidney Disease.  CBC     Status: None   Collection Time    05/24/12  5:15 AM      Result Value Range   WBC 8.5  4.0 - 10.5 K/uL   RBC 4.00  3.87 - 5.11 MIL/uL   Hemoglobin 12.6  12.0 - 15.0 g/dL   HCT 25.9  56.3 - 87.5 %   MCV 91.0  78.0 - 100.0 fL   MCH 31.5  26.0 - 34.0 pg   MCHC 34.6  30.0 - 36.0 g/dL   RDW 64.3  32.9 - 51.8 %   Platelets 256  150 - 400 K/uL   Ct  Abdomen Pelvis W Contrast  05/23/2012  *RADIOLOGY REPORT*  Clinical Data: Rectal bleeding.  Loose stools.  Abdominal pain. Irritable bowel syndrome.  CT ABDOMEN AND PELVIS WITH CONTRAST  Technique:  Multidetector CT imaging of the abdomen and pelvis was performed following the standard protocol during bolus administration of intravenous contrast.  Contrast: OMNIPAQUE IOHEXOL 300 MG/ML  SOLN  Comparison: CT abdomen and pelvis 12/23/2004.  Findings: Mild dependent atelectasis is present.  The lungs are otherwise clear.  Heart size is normal.  No significant pleural or pericardial effusion is present.  Diffuse fatty infiltration of the liver is noted.  No  discrete hepatic lesions are evident.  The spleen is within normal limits. The stomach, duodenum, and pancreas are within normal limits. The common bile duct and gallbladder are normal.  The adrenal glands and kidneys are within normal limits.  The ureters are unremarkable. The urinary bladder is mostly collapsed.  The rectosigmoid colon is relatively collapsed.  No mass lesion is evident.  The transverse and ascending colon are within normal limits.  The cecum extends deep in the pelvis.  The appendix is visualized and normal.  The small bowel is unremarkable.  No significant adenopathy or free fluid is evident.  The bone windows are unremarkable.  IMPRESSION:  1.  Normal appearance of the colon. 2.  Hepatic steatosis. 3.  No acute or focal abnormality to explain the patient's symptoms.   Original Report Authenticated By: Marin Roberts, M.D.     Assessment: Acute enterocolitis with rectal bleeding, no gross evidence of bowel wall thickening on CT scan, presumed infectious  Plan:  Will start Cipro empirically and obtain GI pathogen panel. She probably needs colonoscopy at some point it would like to have it done before she leaves the hospital. Will advance diet slowly as tolerated. Mckenlee Mangham C 05/24/2012, 7:25 AM

## 2012-05-24 NOTE — Progress Notes (Signed)
Utilization review completed. Nikky Duba, RN, BSN. 

## 2012-05-24 NOTE — Progress Notes (Signed)
[facility] PROGRESS NOTE  Mary [name] UJW:[id number] DOB: 02-Jul-1970 DOA: 05/23/2012 PCP: Default, Provider, MD  Assessment/Plan: Principal Problem:   Abdominal pain, acute Active Problems:   Rectal bleeding   Nausea and vomiting   Dehydration    1. Acute Enterocolitis: Patient presented with a 1-day history of recurrent blood diarrhea, associated with abdominal pain. Abdominal/pelvic CT  Scan was negative for acute or focal abnormality, although there was hepatic steatosis. Managing with bowel rest, iv fluids, PPI and and antiemetics. Today, patient feels better, and is tolerating full liquid diet. Dr Dorena Cookey provided GI consultation has recommended Ciprofloxacin treatment, and colonoscopic evaluation is scheduled for 05/25/12. Patient had no diarrhea today.  2. Dehydration: Patient appeared clinically dehydrated at presentation, but hydration status has improved with iv fluids. Continued.    Code Status: Full Code.  Family Communication:  Disposition Plan: To be determined.    Brief narrative: 42 year old female with a history of migraines, hiatal hernia status post repair, possible IBS who presented with rectal bleeding that started with crampy abdominal pain last night. Patient states that she had diffuse abdominal pain, crampy in nature last night and in when she used the toilet she had bright red blood. She also has nausea and vomiting and unable to hold anything down. She had 7 episodes of rectal bleeding since last night. She feels generally weak but no dizziness lightheadedness syncope any chest pain.  Patient also states that she usually has crampy abdominal pain before having bowel movement for last several years but no prior bleeding. She denies any NSAID use.    Consultants:  Dr Dorena Cookey, GI  Procedures:  Abdominal/pelvic CT scan.   Antibiotics:  Ciprofloxacin 05/24/12>>>  HPI/Subjective: Feels much better. No vomiting or diarrhea today.    Objective: Vital signs in last 24 hours: Temp:  [97.5 F (36.4 C)-98 F (36.7 C)] 97.9 F (36.6 C) (04/10 0500) Pulse Rate:  [54-125] 65 (04/10 0500) Resp:  [18-20] 18 (04/10 0500) BP: (97-117)/(64-84) 105/69 mmHg (04/10 0500) SpO2:  [98 %-100 %] 98 % (04/10 0500) Weight:  [80.5 kg (177 lb 7.5 oz)] 80.5 kg (177 lb 7.5 oz) (04/09 2200) Weight change:  Last BM Date: 05/23/12  Intake/Output from previous day: 04/09 0701 - 04/10 0700 In: 550 [P.O.:550] Out: -      Physical Exam: General: Comfortable, alert, communicative, fully oriented, not short of breath at rest.  HEENT:  No clinical pallor, no jaundice, no conjunctival injection or discharge. Hydration is satisfactory.  NECK:  Supple, JVP not seen, no carotid bruits, no palpable lymphadenopathy, no palpable goiter. CHEST:  Clinically clear to auscultation, no wheezes, no crackles. HEART:  Sounds 1 and 2 heard, normal, regular, no murmurs. ABDOMEN:  Full, soft, non-tender, no palpable organomegaly, no palpable masses, normal bowel sounds. GENITALIA:  Not examined. LOWER EXTREMITIES:  No pitting edema, palpable peripheral pulses. MUSCULOSKELETAL SYSTEM:  Unremarkable. CENTRAL NERVOUS SYSTEM:  No focal neurologic deficit on gross examination.  Lab Results:  Recent Labs  05/23/12 1611 05/24/12 0515  WBC 10.0 8.5  HGB 14.4 12.6  HCT 40.8 36.4  PLT 274 256    Recent Labs  05/23/12 1611 05/24/12 0515  NA 136 137  K 4.2 4.2  CL 99 105  CO2 28 26  GLUCOSE 92 108*  BUN 9 6  CREATININE 0.91 1.01  CALCIUM 10.5 9.2   No results found for this or any previous visit (from the past 240 hour(s)).   Studies/Results: Ct Abdomen Pelvis W Contrast  05/23/2012  *RADIOLOGY REPORT*  Clinical Data: Rectal bleeding.  Loose stools.  Abdominal pain. Irritable bowel syndrome.  CT ABDOMEN AND PELVIS WITH CONTRAST  Technique:  Multidetector CT imaging of the abdomen and pelvis was performed following the standard protocol during  bolus administration of intravenous contrast.  Contrast: OMNIPAQUE IOHEXOL 300 MG/ML  SOLN  Comparison: CT abdomen and pelvis 12/23/2004.  Findings: Mild dependent atelectasis is present.  The lungs are otherwise clear.  Heart size is normal.  No significant pleural or pericardial effusion is present.  Diffuse fatty infiltration of the liver is noted.  No discrete hepatic lesions are evident.  The spleen is within normal limits. The stomach, duodenum, and pancreas are within normal limits. The common bile duct and gallbladder are normal.  The adrenal glands and kidneys are within normal limits.  The ureters are unremarkable. The urinary bladder is mostly collapsed.  The rectosigmoid colon is relatively collapsed.  No mass lesion is evident.  The transverse and ascending colon are within normal limits.  The cecum extends deep in the pelvis.  The appendix is visualized and normal.  The small bowel is unremarkable.  No significant adenopathy or free fluid is evident.  The bone windows are unremarkable.  IMPRESSION:  1.  Normal appearance of the colon. 2.  Hepatic steatosis. 3.  No acute or focal abnormality to explain the patient's symptoms.   Original Report Authenticated By: Marin Roberts, M.D.     Medications: Scheduled Meds: . pantoprazole (PROTONIX) IV  40 mg Intravenous Q12H  . sodium chloride  3 mL Intravenous Q12H   Continuous Infusions: . sodium chloride 100 mL/hr at 05/23/12 2217   PRN Meds:.acetaminophen, acetaminophen, alum & mag hydroxide-simeth, HYDROcodone-acetaminophen, HYDROmorphone (DILAUDID) injection, ondansetron (ZOFRAN) IV, ondansetron    LOS: 1 day   Aundraya Dripps,CHRISTOPHER  [facility] Pager [phone]. If 8PM-8AM, please contact night-coverage at [email or web address], password [facility] 05/24/2012, 7:46 AM  LOS: 1 day

## 2012-05-25 LAB — CBC
HCT: 34.6 % — ABNORMAL LOW (ref 36.0–46.0)
Hemoglobin: 11.7 g/dL — ABNORMAL LOW (ref 12.0–15.0)
MCH: 31 pg (ref 26.0–34.0)
MCHC: 33.8 g/dL (ref 30.0–36.0)
MCV: 91.8 fL (ref 78.0–100.0)
RBC: 3.77 MIL/uL — ABNORMAL LOW (ref 3.87–5.11)

## 2012-05-25 LAB — COMPREHENSIVE METABOLIC PANEL
ALT: 17 U/L (ref 0–35)
BUN: 6 mg/dL (ref 6–23)
CO2: 28 mEq/L (ref 19–32)
Calcium: 9.6 mg/dL (ref 8.4–10.5)
GFR calc Af Amer: 80 mL/min — ABNORMAL LOW (ref >90)
GFR calc non Af Amer: 69 mL/min — ABNORMAL LOW (ref >90)
Glucose, Bld: 92 mg/dL (ref 70–99)
Sodium: 140 mEq/L (ref 135–145)
Total Protein: 6.1 g/dL (ref 6.0–8.3)

## 2012-05-25 LAB — MAGNESIUM: Magnesium: 2 mg/dL (ref 1.5–2.5)

## 2012-05-25 MED ORDER — CIPROFLOXACIN 500 MG/5ML (10%) PO SUSR: 500.0000 mg | Freq: Two times a day (BID) | ORAL | Status: DC

## 2012-05-25 MED ORDER — CIPROFLOXACIN HCL 500 MG PO TABS
500.0000 mg | ORAL_TABLET | Freq: Two times a day (BID) | ORAL | Status: DC
  Administered 2012-05-25 – 2012-05-27 (×4): 500 mg via ORAL
  Filled 2012-05-25 (×6): qty 1

## 2012-05-25 MED ORDER — CIPROFLOXACIN HCL 500 MG PO TABS
500.0000 mg | ORAL_TABLET | Freq: Two times a day (BID) | ORAL | Status: DC
  Filled 2012-05-25 (×3): qty 1

## 2012-05-25 NOTE — Progress Notes (Signed)
Pt c/o sharp CP 10/10 without radiatio after about 5 mins pain decreased to a 5/10. Did not get worse with inspiration or expiration. VS stable and WNL. EKG showed SB otherwise normal. Pt was given 1 mg dilaudid. MD on call notified. No new orders given at this time. Will continue to monitor the pt.

## 2012-05-25 NOTE — Progress Notes (Signed)
[facility] PROGRESS NOTE  Mary [name] ZOX:[id number] DOB: 09/15/1970 DOA: 05/23/2012 PCP: Default, Provider, MD  Assessment/Plan: Principal Problem:   Abdominal pain, acute Active Problems:   Rectal bleeding   Nausea and vomiting   Dehydration    1. Acute Enterocolitis: Patient presented with a 1-day history of recurrent blood diarrhea, associated with abdominal pain. Abdominal/pelvic CT  Scan was negative for acute or focal abnormality, although there was hepatic steatosis. Managing with bowel rest, iv fluids, PPI and and antiemetics. Dr Dorena Cookey provided GI consultation has recommended Ciprofloxacin treatment, now day# 2. As of 05/24/12, patient felt better, tolerated full liquid diet, and had no bowel movement. Today, she has had a small amount of non-bloody stool. Diet has been advanced further, and colonoscopy will be dione prior to discharge. Stool pathogens are pending.  2. Dehydration: Patient appeared clinically dehydrated at presentation, but hydration status has improved with iv fluids. [place]ock today.    Code Status: Full Code.  Family Communication:  Disposition Plan: To be determined.    Brief narrative: 42 year old female with a history of migraines, hiatal hernia status post repair, possible IBS who presented with rectal bleeding that started with crampy abdominal pain last night. Patient states that she had diffuse abdominal pain, crampy in nature last night and in when she used the toilet she had bright red blood. She also has nausea and vomiting and unable to hold anything down. She had 7 episodes of rectal bleeding since last night. She feels generally weak but no dizziness lightheadedness syncope any chest pain.  Patient also states that she usually has crampy abdominal pain before having bowel movement for last several years but no prior bleeding. She denies any NSAID use.    Consultants:  Dr Dorena Cookey, GI  Procedures:  Abdominal/pelvic CT scan.    Antibiotics:  Ciprofloxacin 05/24/12>>>  HPI/Subjective: Feels much better. No vomiting, has small amount of non-bloody stool today. .   Objective: Vital signs in last 24 hours: Temp:  [97.8 F (36.6 C)-98.4 F (36.9 C)] 97.8 F (36.6 C) (04/11 0500) Pulse Rate:  [55-65] 65 (04/11 0500) Resp:  [18-20] 18 (04/11 0500) BP: (112-120)/(72-81) 112/81 mmHg (04/11 0500) SpO2:  [99 %-100 %] 99 % (04/11 0500) Weight change:  Last BM Date: 05/22/12  Intake/Output from previous day: 04/10 0701 - 04/11 0700 In: 2040 [P.O.:840; I.V.:1000; IV Piggyback:200] Out: 1375 [Urine:1375] Total I/O In: 3 [I.V.:3] Out: -    Physical Exam: General: Comfortable, alert, communicative, fully oriented, not short of breath at rest.  HEENT:  No clinical pallor, no jaundice, no conjunctival injection or discharge. Hydration is satisfactory.  NECK:  Supple, JVP not seen, no carotid bruits, no palpable lymphadenopathy, no palpable goiter. CHEST:  Clinically clear to auscultation, no wheezes, no crackles. HEART:  Sounds 1 and 2 heard, normal, regular, no murmurs. ABDOMEN:  Full, soft, non-tender, no palpable organomegaly, no palpable masses, normal bowel sounds. GENITALIA:  Not examined. LOWER EXTREMITIES:  No pitting edema, palpable peripheral pulses. MUSCULOSKELETAL SYSTEM:  Unremarkable. CENTRAL NERVOUS SYSTEM:  No focal neurologic deficit on gross examination.  Lab Results:  Recent Labs  05/24/12 0515 05/25/12 0502  WBC 8.5 6.5  HGB 12.6 11.7*  HCT 36.4 34.6*  PLT 256 242    Recent Labs  05/24/12 0515 05/25/12 0502  NA 137 140  K 4.2 4.3  CL 105 108  CO2 26 28  GLUCOSE 108* 92  BUN 6 6  CREATININE 1.01 0.99  CALCIUM 9.2 9.6  No results found for this or any previous visit (from the past 240 hour(s)).   Studies/Results: Ct Abdomen Pelvis W Contrast  05/23/2012  *RADIOLOGY REPORT*  Clinical Data: Rectal bleeding.  Loose stools.  Abdominal pain. Irritable bowel syndrome.  CT  ABDOMEN AND PELVIS WITH CONTRAST  Technique:  Multidetector CT imaging of the abdomen and pelvis was performed following the standard protocol during bolus administration of intravenous contrast.  Contrast: OMNIPAQUE IOHEXOL 300 MG/ML  SOLN  Comparison: CT abdomen and pelvis 12/23/2004.  Findings: Mild dependent atelectasis is present.  The lungs are otherwise clear.  Heart size is normal.  No significant pleural or pericardial effusion is present.  Diffuse fatty infiltration of the liver is noted.  No discrete hepatic lesions are evident.  The spleen is within normal limits. The stomach, duodenum, and pancreas are within normal limits. The common bile duct and gallbladder are normal.  The adrenal glands and kidneys are within normal limits.  The ureters are unremarkable. The urinary bladder is mostly collapsed.  The rectosigmoid colon is relatively collapsed.  No mass lesion is evident.  The transverse and ascending colon are within normal limits.  The cecum extends deep in the pelvis.  The appendix is visualized and normal.  The small bowel is unremarkable.  No significant adenopathy or free fluid is evident.  The bone windows are unremarkable.  IMPRESSION:  1.  Normal appearance of the colon. 2.  Hepatic steatosis. 3.  No acute or focal abnormality to explain the patient's symptoms.   Original Report Authenticated By: Marin Roberts, M.D.     Medications: Scheduled Meds: . ciprofloxacin  500 mg Oral BID  . pantoprazole (PROTONIX) IV  40 mg Intravenous Q12H  . sodium chloride  3 mL Intravenous Q12H   Continuous Infusions: . sodium chloride 100 mL/hr at 05/24/12 2316   PRN Meds:.acetaminophen, acetaminophen, alum & mag hydroxide-simeth, diphenhydrAMINE, HYDROcodone-acetaminophen, HYDROmorphone (DILAUDID) injection, ondansetron (ZOFRAN) IV, ondansetron    LOS: 2 days   Gaspare Netzel,CHRISTOPHER  [facility] Pager [phone]. If 8PM-8AM, please contact night-coverage at [email or web address],  password [facility] 05/25/2012, 12:01 PM  LOS: 2 days

## 2012-05-25 NOTE — Progress Notes (Signed)
UR Completed Nazar Kuan Graves-Bigelow, RN,BSN [phone]  

## 2012-05-25 NOTE — Progress Notes (Signed)
[facility] Progress Note  Subjective: Patient still some cramps, minimal stool., Nonbloody today  Objective: Vital signs in last 24 hours: Temp:  [97.8 F (36.6 C)-98.4 F (36.9 C)] 97.8 F (36.6 C) (04/11 0500) Pulse Rate:  [55-65] 65 (04/11 0500) Resp:  [18-20] 18 (04/11 0500) BP: (112-120)/(72-81) 112/81 mmHg (04/11 0500) SpO2:  [99 %-100 %] 99 % (04/11 0500) Weight change:    PE: Abdomen soft mildly tender especially the left lower quadrant  Lab Results: Results for orders placed during the hospital encounter of 05/23/12 (from the past 24 hour(s))  CBC     Status: Abnormal   Collection Time    05/25/12  5:02 AM      Result Value Range   WBC 6.5  4.0 - 10.5 K/uL   RBC 3.77 (*) 3.87 - 5.11 MIL/uL   Hemoglobin 11.7 (*) 12.0 - 15.0 g/dL   HCT 40.9 (*) 81.1 - 91.4 %   MCV 91.8  78.0 - 100.0 fL   MCH 31.0  26.0 - 34.0 pg   MCHC 33.8  30.0 - 36.0 g/dL   RDW 78.2  95.6 - 21.3 %   Platelets 242  150 - 400 K/uL  COMPREHENSIVE METABOLIC PANEL     Status: Abnormal   Collection Time    05/25/12  5:02 AM      Result Value Range   Sodium 140  135 - 145 mEq/L   Potassium 4.3  3.5 - 5.1 mEq/L   Chloride 108  96 - 112 mEq/L   CO2 28  19 - 32 mEq/L   Glucose, Bld 92  70 - 99 mg/dL   BUN 6  6 - 23 mg/dL   Creatinine, Ser 0.86  0.50 - 1.10 mg/dL   Calcium 9.6  8.4 - 57.8 mg/dL   Total Protein 6.1  6.0 - 8.3 g/dL   Albumin 3.2 (*) 3.5 - 5.2 g/dL   AST 20  0 - 37 U/L   ALT 17  0 - 35 U/L   Alkaline Phosphatase 62  39 - 117 U/L   Total Bilirubin 0.6  0.3 - 1.2 mg/dL   GFR calc non Af Amer 69 (*) >90 mL/min   GFR calc Af Amer 80 (*) >90 mL/min  MAGNESIUM     Status: None   Collection Time    05/25/12  5:02 AM      Result Value Range   Magnesium 2.0  1.5 - 2.5 mg/dL    Studies/Results: Ct Abdomen Pelvis W Contrast  05/23/2012  *RADIOLOGY REPORT*  Clinical Data: Rectal bleeding.  Loose stools.  Abdominal pain. Irritable bowel syndrome.  CT ABDOMEN AND PELVIS WITH  CONTRAST  Technique:  Multidetector CT imaging of the abdomen and pelvis was performed following the standard protocol during bolus administration of intravenous contrast.  Contrast: OMNIPAQUE IOHEXOL 300 MG/ML  SOLN  Comparison: CT abdomen and pelvis 12/23/2004.  Findings: Mild dependent atelectasis is present.  The lungs are otherwise clear.  Heart size is normal.  No significant pleural or pericardial effusion is present.  Diffuse fatty infiltration of the liver is noted.  No discrete hepatic lesions are evident.  The spleen is within normal limits. The stomach, duodenum, and pancreas are within normal limits. The common bile duct and gallbladder are normal.  The adrenal glands and kidneys are within normal limits.  The ureters are unremarkable. The urinary bladder is mostly collapsed.  The rectosigmoid colon is relatively collapsed.  No mass lesion is evident.  The transverse and ascending colon are within normal limits.  The cecum extends deep in the pelvis.  The appendix is visualized and normal.  The small bowel is unremarkable.  No significant adenopathy or free fluid is evident.  The bone windows are unremarkable.  IMPRESSION:  1.  Normal appearance of the colon. 2.  Hepatic steatosis. 3.  No acute or focal abnormality to explain the patient's symptoms.   Original Report Authenticated By: Marin Roberts, M.D.       Assessment: Presumed acute enterocolitis superimposed on chronic symptoms most compatible with irritable bowel syndrome, CT scan negative. Enteric pathogens panel pending  Plan: Continue Cipro for now, we'll switch to oral while awaiting GI pathogens panel. Needs colonoscopy at some point and patient would somewhat like to do it while she is in the hospital if possible. Will followup tomorrow. Will allow bland diet today.    Mary [name] C 05/25/2012, 11:00 AM

## 2012-05-26 LAB — BASIC METABOLIC PANEL
BUN: 10 mg/dL (ref 6–23)
CO2: 29 mEq/L (ref 19–32)
Chloride: 104 mEq/L (ref 96–112)
Glucose, Bld: 91 mg/dL (ref 70–99)
Potassium: 3.9 mEq/L (ref 3.5–5.1)
Sodium: 141 mEq/L (ref 135–145)

## 2012-05-26 LAB — CBC
HCT: 34 % — ABNORMAL LOW (ref 36.0–46.0)
Hemoglobin: 11.9 g/dL — ABNORMAL LOW (ref 12.0–15.0)
MCH: 31.7 pg (ref 26.0–34.0)
MCHC: 35 g/dL (ref 30.0–36.0)
RBC: 3.75 MIL/uL — ABNORMAL LOW (ref 3.87–5.11)

## 2012-05-26 MED ORDER — PEG 3350-KCL-NA BICARB-NACL 420 G PO SOLR
4000.0000 mL | Freq: Once | ORAL | Status: DC
  Filled 2012-05-26: qty 4000

## 2012-05-26 MED ORDER — PANTOPRAZOLE SODIUM 40 MG PO TBEC
40.0000 mg | DELAYED_RELEASE_TABLET | Freq: Two times a day (BID) | ORAL | Status: DC
  Administered 2012-05-26 – 2012-05-27 (×2): 40 mg via ORAL
  Filled 2012-05-26 (×2): qty 1

## 2012-05-26 MED ORDER — SODIUM CHLORIDE 0.9 % IV SOLN
INTRAVENOUS | Status: DC
  Administered 2012-05-27: via INTRAVENOUS

## 2012-05-26 NOTE — Progress Notes (Signed)
Mary [name] 10:28 AM  Subjective: Patient without any new complaints and minimal diarrhea and no obvious blood and only minimal left upper quadrant discomfort and tolerating regular diet  Objective: Vital signs stable afebrile no acute distress abdomen is soft nontender hemoglobin stable stool study pending CT negative  Assessment: Seems improved to me  Plan: Okay with me to proceed with outpatient management and probable outpatient colonoscopy by my partner Dr. [name] which I discussed with the patient  [facility]

## 2012-05-26 NOTE — Progress Notes (Signed)
[facility] PROGRESS NOTE  Mary [name] ZOX:[id number] DOB: 09/22/1970 DOA: 05/23/2012 PCP: Default, Provider, MD  Assessment/Plan: Principal Problem:   Abdominal pain, acute Active Problems:   Rectal bleeding   Nausea and vomiting   Dehydration    1. Acute Enterocolitis: Patient presented with a 1-day history of recurrent blood diarrhea, associated with abdominal pain. Abdominal/pelvic CT scan was negative for acute or focal abnormality, although there was hepatic steatosis. Managed with bowel rest, iv fluids, PPI and and antiemetics. Dr Dorena Cookey provided GI consultation and recommended Ciprofloxacin treatment in addition, now day# 3. As of 05/24/12, patient felt better, tolerated full liquid diet, and had no bowel movement. On 05/25/12, she has had a small amount of non-bloody stool. Diet has been advanced further, she is tolerating a regular diet today. Stool pathogens are still pending. Have discussed further with GI. For bowel prep on 05/27/12, with a view to colonoscopy on 05/28/12.  2. Dehydration: Patient appeared clinically dehydrated at presentation, but hydration status has improved with iv fluids. IV fluids were discontinued on 05/25/12, but will be restarted at midnight in anticipation of bowel prep.    Code Status: Full Code.  Family Communication:  Disposition Plan: To be determined.    Brief narrative: 42 year old female with a history of migraines, hiatal hernia status post repair, possible IBS who presented with rectal bleeding that started with crampy abdominal pain last night. Patient states that she had diffuse abdominal pain, crampy in nature last night and in when she used the toilet she had bright red blood. She also has nausea and vomiting and unable to hold anything down. She had 7 episodes of rectal bleeding since last night. She feels generally weak but no dizziness lightheadedness syncope any chest pain.  Patient also states that she usually has crampy  abdominal pain before having bowel movement for last several years but no prior bleeding. She denies any NSAID use.    Consultants:  Dr Dorena Cookey, GI  Procedures:  Abdominal/pelvic CT scan.   Antibiotics:  Ciprofloxacin 05/24/12>>>  HPI/Subjective: Feels much better. No vomiting, no stool today.  Objective: Vital signs in last 24 hours: Temp:  [97.2 F (36.2 C)-99.4 F (37.4 C)] 98.5 F (36.9 C) (04/12 1355) Pulse Rate:  [62-80] 80 (04/12 1355) Resp:  [18-20] 20 (04/12 1355) BP: (95-115)/(69-72) 95/69 mmHg (04/12 1355) SpO2:  [96 %-100 %] 96 % (04/12 1355) Weight change:  Last BM Date: 05/25/12  Intake/Output from previous day: 04/11 0701 - 04/12 0700 In: 1363 [P.O.:960; I.V.:203; IV Piggyback:200] Out: -  Total I/O In: 600 [P.O.:600] Out: -    Physical Exam: General: Comfortable, alert, communicative, fully oriented, not short of breath at rest.  HEENT:  No clinical pallor, no jaundice, no conjunctival injection or discharge. Hydration is satisfactory.  NECK:  Supple, JVP not seen, no carotid bruits, no palpable lymphadenopathy, no palpable goiter. CHEST:  Clinically clear to auscultation, no wheezes, no crackles. HEART:  Sounds 1 and 2 heard, normal, regular, no murmurs. ABDOMEN:  Full, soft, non-tender, no palpable organomegaly, no palpable masses, normal bowel sounds. GENITALIA:  Not examined. LOWER EXTREMITIES:  No pitting edema, palpable peripheral pulses. MUSCULOSKELETAL SYSTEM:  Unremarkable. CENTRAL NERVOUS SYSTEM:  No focal neurologic deficit on gross examination.  Lab Results:  Recent Labs  05/25/12 0502 05/26/12 0500  WBC 6.5 5.6  HGB 11.7* 11.9*  HCT 34.6* 34.0*  PLT 242 244    Recent Labs  05/25/12 0502 05/26/12 0500  NA 140 141  K  4.3 3.9  CL 108 104  CO2 28 29  GLUCOSE 92 91  BUN 6 10  CREATININE 0.99 1.12*  CALCIUM 9.6 9.6   No results found for this or any previous visit (from the past 240 hour(s)).    Studies/Results: No results found.  Medications: Scheduled Meds: . ciprofloxacin  500 mg Oral BID  . pantoprazole  40 mg Oral BID  . sodium chloride  3 mL Intravenous Q12H   Continuous Infusions:   PRN Meds:.acetaminophen, acetaminophen, alum & mag hydroxide-simeth, diphenhydrAMINE, HYDROcodone-acetaminophen, HYDROmorphone (DILAUDID) injection, ondansetron (ZOFRAN) IV, ondansetron    LOS: 3 days   Phuc Kluttz,CHRISTOPHER  [facility] Pager [phone]. If 8PM-8AM, please contact night-coverage at [email or web address], password [facility] 05/26/2012, 4:57 PM  LOS: 3 days

## 2012-05-27 MED ORDER — CIPROFLOXACIN HCL 500 MG PO TABS: 500.0000 mg | ORAL_TABLET | Freq: Two times a day (BID) | ORAL | Status: DC

## 2012-05-27 MED ORDER — FLUCONAZOLE IN SODIUM CHLORIDE 200-0.9 MG/100ML-% IV SOLN
200.0000 mg | Freq: Once | INTRAVENOUS | Status: AC
  Administered 2012-05-27: 200 mg via INTRAVENOUS
  Filled 2012-05-27: qty 100

## 2012-05-27 MED ORDER — HYDROCODONE-ACETAMINOPHEN 5-325 MG PO TABS: 2.0000 | ORAL_TABLET | ORAL | Status: DC | PRN

## 2012-05-27 MED ORDER — ONDANSETRON HCL 4 MG PO TABS: 4.0000 mg | ORAL_TABLET | Freq: Three times a day (TID) | ORAL | Status: DC | PRN

## 2012-05-27 MED ORDER — FLUCONAZOLE 100 MG PO TABS: 100.0000 mg | ORAL_TABLET | Freq: Every day | ORAL | Status: DC

## 2012-05-27 NOTE — Discharge Summary (Signed)
Physician Discharge Summary  Mary [name] AVW:[id number] DOB: [birth year] DOA: 05/23/2012  PCP: Default, Provider, MD  Admit date: 05/23/2012 Discharge date: 05/27/2012  Time spent: 30 minutes  Recommendations for Outpatient Follow-up:  1. Follow with [name] as an outpatient for colonoscopy.  Discharge Diagnoses:  Principal Problem:   Abdominal pain, acute Active Problems:   Rectal bleeding   Nausea and vomiting   Dehydration   Discharge Condition: stable  Diet recommendation: heart healthy  Filed Weights   05/23/12 2200  Weight: 80.5 kg (177 lb 7.5 oz)    History of present illness:  42 year old female with a history of hiatal hernia status post repair, possible IBS who presented with rectal bleeding that started with crampy abdominal pain last night. Patient states that she had diffuse abdominal pain, crampy in nature last night and in when she used the toilet she had bright red blood. She also has nausea and vomiting and unable to hold anything down. She had 7 episodes of rectal bleeding since last night. She feels generally weak but no dizziness lightheadedness syncope any chest pain.  Patient also states that she usually has crampy abdominal pain before having bowel movement for last several years but no prior bleeding. She denies any NSAID use.    Hospital Course:  Abdominal pain, acute/ Rectal bleeding : -Abdominal/pelvic CT scan was negative for acute or focal abnormality, although there was hepatic steatosis. - Started on Cipro, no  Further bloody diarrhea. No further BM. - She manage conservatively with IV fluids. - GI was consulted, recommended colonoscopy as an outpatient.  Dehydration  - Resolved with IV fluids.  Procedures:  None  Consultations:  Gi Dr. [name].  Discharge Exam: Filed Vitals:   05/26/12 0623 05/26/12 1355 05/26/12 2100 05/27/12 0434  BP: 115/70 95/69 118/67 122/83  Pulse: 76 80 65 62  Temp: 97.2 F (36.2 C) 98.5 F (36.9 C) 98.7  F (37.1 C) 98.2 F (36.8 C)  TempSrc: Axillary Oral Oral Oral  Resp:  20 18 18   Height:      Weight:      SpO2: 100% 96% 100% 98%    General: see progress note. Discharge Instructions  Discharge Orders   Future Orders Complete By Expires     Diet - low sodium heart healthy  As directed     Increase activity slowly  As directed         Medication List    TAKE these medications       ciprofloxacin 500 MG tablet  Commonly known as:  CIPRO  Take 1 tablet (500 mg total) by mouth 2 (two) times daily.     HYDROcodone-acetaminophen 5-325 MG per tablet  Commonly known as:  NORCO/VICODIN  Take 2 tablets by mouth every 4 (four) hours as needed.        The results of significant diagnostics from this hospitalization (including imaging, microbiology, ancillary and laboratory) are listed below for reference.    Significant Diagnostic Studies: Ct Abdomen Pelvis W Contrast  05/23/2012  *RADIOLOGY REPORT*  Clinical Data: Rectal bleeding.  Loose stools.  Abdominal pain. Irritable bowel syndrome.  CT ABDOMEN AND PELVIS WITH CONTRAST  Technique:  Multidetector CT imaging of the abdomen and pelvis was performed following the standard protocol during bolus administration of intravenous contrast.  Contrast: OMNIPAQUE IOHEXOL 300 MG/ML  SOLN  Comparison: CT abdomen and pelvis 12/23/2004.  Findings: Mild dependent atelectasis is present.  The lungs are otherwise clear.  Heart size is normal.  No significant pleural or pericardial effusion is present.  Diffuse fatty infiltration of the liver is noted.  No discrete hepatic lesions are evident.  The spleen is within normal limits. The stomach, duodenum, and pancreas are within normal limits. The common bile duct and gallbladder are normal.  The adrenal glands and kidneys are within normal limits.  The ureters are unremarkable. The urinary bladder is mostly collapsed.  The rectosigmoid colon is relatively collapsed.  No mass lesion is evident.  The  transverse and ascending colon are within normal limits.  The cecum extends deep in the pelvis.  The appendix is visualized and normal.  The small bowel is unremarkable.  No significant adenopathy or free fluid is evident.  The bone windows are unremarkable.  IMPRESSION:  1.  Normal appearance of the colon. 2.  Hepatic steatosis. 3.  No acute or focal abnormality to explain the patient's symptoms.   Original Report Authenticated By: Marin Roberts, M.D.     Microbiology: No results found for this or any previous visit (from the past 240 hour(s)).   Labs: Basic Metabolic Panel:  Recent Labs Lab 05/23/12 1611 05/24/12 0515 05/25/12 0502 05/26/12 0500  NA 136 137 140 141  K 4.2 4.2 4.3 3.9  CL 99 105 108 104  CO2 28 26 28 29   GLUCOSE 92 108* 92 91  BUN 9 6 6 10   CREATININE 0.91 1.01 0.99 1.12*  CALCIUM 10.5 9.2 9.6 9.6  MG  --   --  2.0  --    Liver Function Tests:  Recent Labs Lab 05/23/12 1611 05/25/12 0502  AST 24 20  ALT 22 17  ALKPHOS 85 62  BILITOT 0.6 0.6  PROT 8.3 6.1  ALBUMIN 4.5 3.2*   No results found for this basename: LIPASE, AMYLASE,  in the last 168 hours No results found for this basename: AMMONIA,  in the last 168 hours CBC:  Recent Labs Lab 05/23/12 1611 05/24/12 0515 05/25/12 0502 05/26/12 0500  WBC 10.0 8.5 6.5 5.6  HGB 14.4 12.6 11.7* 11.9*  HCT 40.8 36.4 34.6* 34.0*  MCV 90.9 91.0 91.8 90.7  PLT 274 256 242 244   Cardiac Enzymes: No results found for this basename: CKTOTAL, CKMB, CKMBINDEX, TROPONINI,  in the last 168 hours BNP: BNP (last 3 results) No results found for this basename: PROBNP,  in the last 8760 hours CBG: No results found for this basename: GLUCAP,  in the last 168 hours  Signed:  Marinda Elk  [facility] 05/27/2012, 9:12 AM

## 2012-05-27 NOTE — Progress Notes (Signed)
[facility] PROGRESS NOTE  Assessment/Plan: Abdominal pain, acute/ Rectal bleeding - Day 3 of cipro. - no further bloody BM.   Dehydration - Resolved.   Code Status: Full Code.  Family Communication:  Disposition Plan: To be determined.     Consultants:  GI  Procedures:  Colonoscopy 4.14.2014  Antibiotics: Cipro 4.13.2014  HPI/Subjective: No further abdominal pain. No diarhea, tolerating diet.  Objective: Filed Vitals:   05/26/12 0623 05/26/12 1355 05/26/12 2100 05/27/12 0434  BP: 115/70 95/69 118/67 122/83  Pulse: 76 80 65 62  Temp: 97.2 F (36.2 C) 98.5 F (36.9 C) 98.7 F (37.1 C) 98.2 F (36.8 C)  TempSrc: Axillary Oral Oral Oral  Resp:  20 18 18   Height:      Weight:      SpO2: 100% 96% 100% 98%    Intake/Output Summary (Last 24 hours) at 05/27/12 0843 Last data filed at 05/26/12 1700  Gross per 24 hour  Intake    960 ml  Output      0 ml  Net    960 ml   Filed Weights   05/23/12 2200  Weight: 80.5 kg (177 lb 7.5 oz)    Exam:  General: Alert, awake, oriented x3, in no acute distress.  HEENT: No bruits, no goiter.  Heart: Regular rate and rhythm, without murmurs, rubs, gallops.  Lungs: Good air movement, bilateral air movement.  Abdomen: Soft, nontender, nondistended, positive bowel sounds.  Neuro: Grossly intact, nonfocal.   Data Reviewed: Basic Metabolic Panel:  Recent Labs Lab 05/23/12 1611 05/24/12 0515 05/25/12 0502 05/26/12 0500  NA 136 137 140 141  K 4.2 4.2 4.3 3.9  CL 99 105 108 104  CO2 28 26 28 29   GLUCOSE 92 108* 92 91  BUN 9 6 6 10   CREATININE 0.91 1.01 0.99 1.12*  CALCIUM 10.5 9.2 9.6 9.6  MG  --   --  2.0  --    Liver Function Tests:  Recent Labs Lab 05/23/12 1611 05/25/12 0502  AST 24 20  ALT 22 17  ALKPHOS 85 62  BILITOT 0.6 0.6  PROT 8.3 6.1  ALBUMIN 4.5 3.2*   No results found for this basename: LIPASE, AMYLASE,  in the last 168 hours No results found for this basename: AMMONIA,  in  the last 168 hours CBC:  Recent Labs Lab 05/23/12 1611 05/24/12 0515 05/25/12 0502 05/26/12 0500  WBC 10.0 8.5 6.5 5.6  HGB 14.4 12.6 11.7* 11.9*  HCT 40.8 36.4 34.6* 34.0*  MCV 90.9 91.0 91.8 90.7  PLT 274 256 242 244   Cardiac Enzymes: No results found for this basename: CKTOTAL, CKMB, CKMBINDEX, TROPONINI,  in the last 168 hours BNP (last 3 results) No results found for this basename: PROBNP,  in the last 8760 hours CBG: No results found for this basename: GLUCAP,  in the last 168 hours  No results found for this or any previous visit (from the past 240 hour(s)).   Studies: No results found.  Scheduled Meds: . ciprofloxacin  500 mg Oral BID  . pantoprazole  40 mg Oral BID  . polyethylene glycol-electrolytes  4,000 mL Oral Once  . sodium chloride  3 mL Intravenous Q12H   Continuous Infusions: . sodium chloride 100 mL/hr at 05/27/12 0600     FELIZ Rosine Beat  [facility] Pager [phone]. If 8PM-8AM, please contact night-coverage at [email or web address], password [facility] 05/27/2012, 8:43 AM  LOS: 4 days

## 2012-05-28 LAB — GI PATHOGEN PANEL BY PCR, STOOL
C difficile toxin A/B: NEGATIVE
E coli (ETEC) LT/ST: NEGATIVE
E coli (STEC): NEGATIVE
E coli 0157 by PCR: NEGATIVE
Salmonella by PCR: NEGATIVE
Shigella by PCR: NEGATIVE

## 2012-07-06 ENCOUNTER — Other Ambulatory Visit: Payer: Self-pay | Admitting: Gastroenterology

## 2012-07-11 ENCOUNTER — Ambulatory Visit (HOSPITAL_COMMUNITY)
Admission: RE | Admit: 2012-07-11 | Discharge: 2012-07-11 | Disposition: A | Discharge status: Home / Self Care | Payer: Self-pay | Source: Ambulatory Visit | Attending: Gastroenterology | Admitting: Gastroenterology

## 2012-07-11 ENCOUNTER — Encounter (HOSPITAL_COMMUNITY)
Admission: RE | Disposition: A | Discharge status: Home / Self Care | Payer: Self-pay | Source: Ambulatory Visit | Attending: Gastroenterology

## 2012-07-11 ENCOUNTER — Encounter (HOSPITAL_COMMUNITY): Payer: Self-pay | Admitting: *Deleted

## 2012-07-11 DIAGNOSIS — G43909 Migraine, unspecified, not intractable, without status migrainosus: Secondary | ICD-10-CM | POA: Insufficient documentation

## 2012-07-11 DIAGNOSIS — Z79899 Other long term (current) drug therapy: Secondary | ICD-10-CM | POA: Insufficient documentation

## 2012-07-11 DIAGNOSIS — Z886 Allergy status to analgesic agent status: Secondary | ICD-10-CM | POA: Insufficient documentation

## 2012-07-11 DIAGNOSIS — K625 Hemorrhage of anus and rectum: Secondary | ICD-10-CM | POA: Insufficient documentation

## 2012-07-11 DIAGNOSIS — Z90710 Acquired absence of both cervix and uterus: Secondary | ICD-10-CM | POA: Insufficient documentation

## 2012-07-11 HISTORY — PX: COLONOSCOPY: SHX5424

## 2012-07-11 SURGERY — COLONOSCOPY
Anesthesia: Moderate Sedation

## 2012-07-11 MED ORDER — SODIUM CHLORIDE 0.9 % IV SOLN: INTRAVENOUS | Status: DC

## 2012-07-11 MED ORDER — DIPHENHYDRAMINE HCL 50 MG/ML IJ SOLN
INTRAMUSCULAR | Status: AC
  Filled 2012-07-11: qty 1

## 2012-07-11 MED ORDER — FENTANYL CITRATE 0.05 MG/ML IJ SOLN
INTRAMUSCULAR | Status: DC | PRN
  Administered 2012-07-11 (×4): 25 ug via INTRAVENOUS

## 2012-07-11 MED ORDER — MIDAZOLAM HCL 10 MG/2ML IJ SOLN
INTRAMUSCULAR | Status: AC
  Filled 2012-07-11: qty 4

## 2012-07-11 MED ORDER — MIDAZOLAM HCL 5 MG/5ML IJ SOLN
INTRAMUSCULAR | Status: DC | PRN
  Administered 2012-07-11: 2.5 mg via INTRAVENOUS
  Administered 2012-07-11: 1.5 mg via INTRAVENOUS
  Administered 2012-07-11 (×2): 2.5 mg via INTRAVENOUS

## 2012-07-11 MED ORDER — FENTANYL CITRATE 0.05 MG/ML IJ SOLN
INTRAMUSCULAR | Status: AC
  Filled 2012-07-11: qty 4

## 2012-07-11 NOTE — H&P (Signed)
   [facility] Admission History & Physical  Chief Complaint: rectal bleeding HPI: Anmol R Cervenka is an 42 y.o. black  female.  Who was admiotted with rectal bleeding in April with negative CT, referred for followup colonoscopy  Past Medical History  Diagnosis Date  . Migraines     Past Surgical History  Procedure Laterality Date  . Abdominal hysterectomy    . Hernia repair      Medications Prior to Admission  Medication Sig Dispense Refill  . ondansetron (ZOFRAN) 4 MG tablet Take 1 tablet (4 mg total) by mouth every 8 (eight) hours as needed for nausea.  20 tablet  0  . ciprofloxacin (CIPRO) 500 MG tablet Take 1 tablet (500 mg total) by mouth 2 (two) times daily.  6 tablet  0  . fluconazole (DIFLUCAN) 100 MG tablet Take 1 tablet (100 mg total) by mouth daily.  14 tablet  0  . HYDROcodone-acetaminophen (NORCO/VICODIN) 5-325 MG per tablet Take 2 tablets by mouth every 4 (four) hours as needed.  30 tablet  0    Allergies:  Allergies  Allergen Reactions  . Aspirin Hives    History reviewed. No pertinent family history.  Social History:  reports that she has never smoked. She does not have any smokeless tobacco history on file. She reports that she drinks about 1.8 ounces of alcohol per week. She reports that she does not use illicit drugs.  Review of Systems: negative except as above   Blood pressure 137/90, pulse 67, temperature 98.1 F (36.7 C), temperature source Oral, resp. rate 20, height 5\' 4"  (1.626 m), weight 81.647 kg (180 lb), SpO2 100.00%. Head: Normocephalic, without obvious abnormality, atraumatic Neck: no adenopathy, no carotid bruit, no JVD, supple, symmetrical, trachea midline and thyroid not enlarged, symmetric, no tenderness/mass/nodules Resp: clear to auscultation bilaterally Cardio: regular rate and rhythm, S1, S2 normal, no murmur, click, rub or gallop GI: nml Extremities: extremities normal, atraumatic, no cyanosis or edema  No results  found for this or any previous visit (from the past 48 hour(s)). No results found.  Assessment: Rectal bleeding Plan:  Colonoscopy  Ahmere Hemenway C 07/11/2012, 9:29 AM

## 2012-07-11 NOTE — Op Note (Signed)
[facility] [address] [place], [address]   COLONOSCOPY PROCEDURE REPORT  PATIENT: Mary [name], Mary [name]  MR#: [id number] BIRTHDATE: 02-27-1970 , 41  yrs. old GENDER: Female ENDOSCOPIST: Dorena Cookey, MD REFERRED BY: PROCEDURE DATE:  07/11/2012 PROCEDURE: ASA CLASS: INDICATIONS:  rectal bleeding MEDICATIONS:   Fentanyl 100 mcg versed 9 mg  DESCRIPTION OF PROCEDURE: normal colonoscopy to cecum. No bleeding source seen     COMPLICATIONS: None  ENDOSCOPIC IMPRESSION:Normal study  RECOMMENDATIONS:expectant management. Repeat colon age 75    _______________________________ eSigned:  Dorena Cookey, MD 07/11/2012 10:07 AM

## 2012-07-16 ENCOUNTER — Encounter (HOSPITAL_COMMUNITY): Payer: Self-pay | Admitting: Gastroenterology

## 2012-08-09 NOTE — Addendum Note (Signed)
Addended by: Dorena Cookey on: 08/09/2012 10:11 AM   Modules accepted: Orders

## 2013-07-14 ENCOUNTER — Emergency Department (HOSPITAL_COMMUNITY): Payer: No Typology Code available for payment source

## 2013-07-14 ENCOUNTER — Emergency Department (HOSPITAL_COMMUNITY)
Admission: EM | Admit: 2013-07-14 | Discharge: 2013-07-14 | Disposition: A | Discharge status: Home / Self Care | Payer: No Typology Code available for payment source | Attending: Emergency Medicine | Admitting: Emergency Medicine

## 2013-07-14 ENCOUNTER — Encounter (HOSPITAL_COMMUNITY): Payer: Self-pay | Admitting: Emergency Medicine

## 2013-07-14 DIAGNOSIS — R0789 Other chest pain: Secondary | ICD-10-CM

## 2013-07-14 DIAGNOSIS — Z79899 Other long term (current) drug therapy: Secondary | ICD-10-CM | POA: Insufficient documentation

## 2013-07-14 DIAGNOSIS — R071 Chest pain on breathing: Secondary | ICD-10-CM | POA: Insufficient documentation

## 2013-07-14 DIAGNOSIS — M940 Chondrocostal junction syndrome [Tietze]: Secondary | ICD-10-CM

## 2013-07-14 DIAGNOSIS — Z792 Long term (current) use of antibiotics: Secondary | ICD-10-CM | POA: Insufficient documentation

## 2013-07-14 LAB — COMPREHENSIVE METABOLIC PANEL
ALBUMIN: 4.3 g/dL (ref 3.5–5.2)
ALT: 29 U/L (ref 0–35)
AST: 29 U/L (ref 0–37)
Alkaline Phosphatase: 95 U/L (ref 39–117)
BUN: 12 mg/dL (ref 6–23)
CALCIUM: 10 mg/dL (ref 8.4–10.5)
CO2: 22 mEq/L (ref 19–32)
CREATININE: 0.97 mg/dL (ref 0.50–1.10)
Chloride: 101 mEq/L (ref 96–112)
GFR calc Af Amer: 81 mL/min — ABNORMAL LOW (ref >90)
GFR calc non Af Amer: 70 mL/min — ABNORMAL LOW (ref >90)
Glucose, Bld: 99 mg/dL (ref 70–99)
Potassium: 4 mEq/L (ref 3.7–5.3)
Sodium: 136 mEq/L — ABNORMAL LOW (ref 137–147)
Total Bilirubin: 0.4 mg/dL (ref 0.3–1.2)
Total Protein: 7.9 g/dL (ref 6.0–8.3)

## 2013-07-14 LAB — CBC WITH DIFFERENTIAL/PLATELET
BASOS PCT: 0 % (ref 0–1)
Basophils Absolute: 0 10*3/uL (ref 0.0–0.1)
EOS ABS: 0.3 10*3/uL (ref 0.0–0.7)
Eosinophils Relative: 3 % (ref 0–5)
HCT: 41.7 % (ref 36.0–46.0)
Hemoglobin: 14.6 g/dL (ref 12.0–15.0)
LYMPHS ABS: 3.2 10*3/uL (ref 0.7–4.0)
Lymphocytes Relative: 36 % (ref 12–46)
MCH: 32.5 pg (ref 26.0–34.0)
MCHC: 35 g/dL (ref 30.0–36.0)
MCV: 92.9 fL (ref 78.0–100.0)
Monocytes Absolute: 0.5 10*3/uL (ref 0.1–1.0)
Monocytes Relative: 5 % (ref 3–12)
NEUTROS ABS: 4.9 10*3/uL (ref 1.7–7.7)
NEUTROS PCT: 56 % (ref 43–77)
PLATELETS: 269 10*3/uL (ref 150–400)
RBC: 4.49 MIL/uL (ref 3.87–5.11)
RDW: 13.8 % (ref 11.5–15.5)
WBC: 8.9 10*3/uL (ref 4.0–10.5)

## 2013-07-14 LAB — I-STAT TROPONIN, ED: TROPONIN I, POC: 0 ng/mL (ref 0.00–0.08)

## 2013-07-14 LAB — PRO B NATRIURETIC PEPTIDE: Pro B Natriuretic peptide (BNP): 19.4 pg/mL (ref 0–125)

## 2013-07-14 MED ORDER — DIAZEPAM 5 MG PO TABS
5.0000 mg | ORAL_TABLET | Freq: Once | ORAL | Status: DC
  Filled 2013-07-14: qty 1

## 2013-07-14 MED ORDER — MORPHINE SULFATE 4 MG/ML IJ SOLN
4.0000 mg | Freq: Once | INTRAMUSCULAR | Status: AC
  Administered 2013-07-14: 4 mg via INTRAVENOUS
  Filled 2013-07-14: qty 1

## 2013-07-14 MED ORDER — HYDROCODONE-ACETAMINOPHEN 5-325 MG PO TABS: 1.0000 | ORAL_TABLET | ORAL | Status: DC | PRN

## 2013-07-14 MED ORDER — ONDANSETRON HCL 4 MG/2ML IJ SOLN
4.0000 mg | Freq: Once | INTRAMUSCULAR | Status: AC
  Administered 2013-07-14: 4 mg via INTRAVENOUS
  Filled 2013-07-14: qty 2

## 2013-07-14 MED ORDER — IBUPROFEN 800 MG PO TABS: 800.0000 mg | ORAL_TABLET | Freq: Three times a day (TID) | ORAL | Status: DC | PRN

## 2013-07-14 MED ORDER — KETOROLAC TROMETHAMINE 60 MG/2ML IM SOLN
60.0000 mg | Freq: Once | INTRAMUSCULAR | Status: DC
  Filled 2013-07-14: qty 2

## 2013-07-14 MED ORDER — KETOROLAC TROMETHAMINE 30 MG/ML IJ SOLN
30.0000 mg | Freq: Once | INTRAMUSCULAR | Status: AC
  Administered 2013-07-14: 30 mg via INTRAVENOUS
  Filled 2013-07-14: qty 1

## 2013-07-14 MED ORDER — ONDANSETRON HCL 4 MG PO TABS: 4.0000 mg | ORAL_TABLET | Freq: Three times a day (TID) | ORAL | Status: DC | PRN

## 2013-07-14 NOTE — ED Notes (Signed)
Pt st's chest pain woke her up this am approx 4:30am.  Describes pain as squeezing type pain.  St's pain increases with deep breathing, cough or movement.

## 2013-07-14 NOTE — ED Notes (Signed)
PA at bedside.

## 2013-07-14 NOTE — Discharge Instructions (Signed)
Read the information below.  Use the prescribed medication as directed.  Please discuss all new medications with your pharmacist.  Do not take additional tylenol while taking the prescribed pain medication to avoid overdose.  You may return to the Emergency Department at any time for worsening condition or any new symptoms that concern you.  If there is any possibility that you might be pregnant, please let your health care provider know and discuss this with the pharmacist to ensure medication safety.  If you develop worsening chest pain, shortness of breath, fever, you pass out, or become weak or dizzy, return to the ER for a recheck.    You have been diagnosed by your caregiver as having chest wall pain. SEEK IMMEDIATE MEDICAL ATTENTION IF: You develop a fever.  Your chest pains become severe or intolerable.  You develop new, unexplained symptoms (problems).  You develop shortness of breath, nausea, vomiting, sweating or feel light headed.  You develop a new cough or you cough up blood.   Chest Wall Pain Chest wall pain is pain in or around the bones and muscles of your chest. It may take up to 6 weeks to get better. It may take longer if you must stay physically active in your work and activities.  CAUSES  Chest wall pain may happen on its own. However, it may be caused by:  A viral illness like the flu.  Injury.  Coughing.  Exercise.  Arthritis.  Fibromyalgia.  Shingles. HOME CARE INSTRUCTIONS   Avoid overtiring physical activity. Try not to strain or perform activities that cause pain. This includes any activities using your chest or your abdominal and side muscles, especially if heavy weights are used.  Put ice on the sore area.  Put ice in a plastic bag.  Place a towel between your skin and the bag.  Leave the ice on for 15-20 minutes per hour while awake for the first 2 days.  Only take over-the-counter or prescription medicines for pain, discomfort, or fever as  directed by your caregiver. SEEK IMMEDIATE MEDICAL CARE IF:   Your pain increases, or you are very uncomfortable.  You have a fever.  Your chest pain becomes worse.  You have new, unexplained symptoms.  You have nausea or vomiting.  You feel sweaty or lightheaded.  You have a cough with phlegm (sputum), or you cough up blood. MAKE SURE YOU:   Understand these instructions.  Will watch your condition.  Will get help right away if you are not doing well or get worse. Document Released: 01/31/2005 Document Revised: 04/25/2011 Document Reviewed: 09/27/2010 [facility] Patient Information 2014 [place], [place].  Costochondritis Costochondritis, sometimes called Tietze syndrome, is a swelling and irritation (inflammation) of the tissue (cartilage) that connects your ribs with your breastbone (sternum). It causes pain in the chest and rib area. Costochondritis usually goes away on its own over time. It can take up to 6 weeks or longer to get better, especially if you are unable to limit your activities. CAUSES  Some cases of costochondritis have no known cause. Possible causes include:  Injury (trauma).  Exercise or activity such as lifting.  Severe coughing. SIGNS AND SYMPTOMS  Pain and tenderness in the chest and rib area.  Pain that gets worse when coughing or taking deep breaths.  Pain that gets worse with specific movements. DIAGNOSIS  Your health care provider will do a physical exam and ask about your symptoms. Chest X-rays or other tests may be done to rule out  other problems. TREATMENT  Costochondritis usually goes away on its own over time. Your health care provider may prescribe medicine to help relieve pain. HOME CARE INSTRUCTIONS   Avoid exhausting physical activity. Try not to strain your ribs during normal activity. This would include any activities using chest, abdominal, and side muscles, especially if heavy weights are used.  Apply ice to the affected area  for the first 2 days after the pain begins.  Put ice in a plastic bag.  Place a towel between your skin and the bag.  Leave the ice on for 20 minutes, 2 3 times a day.  Only take over-the-counter or prescription medicines as directed by your health care provider. SEEK MEDICAL CARE IF:  You have redness or swelling at the rib joints. These are signs of infection.  Your pain does not go away despite rest or medicine. SEEK IMMEDIATE MEDICAL CARE IF:   Your pain increases or you are very uncomfortable.  You have shortness of breath or difficulty breathing.  You cough up blood.  You have worse chest pains, sweating, or vomiting.  You have a fever or persistent symptoms for more than 2 3 days.  You have a fever and your symptoms suddenly get worse. MAKE SURE YOU:   Understand these instructions.  Will watch your condition.  Will get help right away if you are not doing well or get worse. Document Released: 11/10/2004 Document Revised: 11/21/2012 Document Reviewed: 09/04/2012 [facility] Patient Information 2014 [place], [place].   Emergency Department Resource Guide 1) Find a Doctor and Pay Out of Pocket Although you won't have to find out who is covered by your insurance plan, it is a good idea to ask around and get recommendations. You will then need to call the office and see if the doctor you have chosen will accept you as a new patient and what types of options they offer for patients who are self-pay. Some doctors offer discounts or will set up payment plans for their patients who do not have insurance, but you will need to ask so you aren't surprised when you get to your appointment.  2) Contact Your Local Health Department Not all health departments have doctors that can see patients for sick visits, but many do, so it is worth a call to see if yours does. If you don't know where your local health department is, you can check in your phone book. The CDC also has a tool to  help you locate your state's health department, and many state websites also have listings of all of their local health departments.  3) Find a Walk-in Clinic If your illness is not likely to be very severe or complicated, you may want to try a walk in clinic. These are popping up all over the country in pharmacies, drugstores, and shopping centers. They're usually staffed by nurse practitioners or physician assistants that have been trained to treat common illnesses and complaints. They're usually fairly quick and inexpensive. However, if you have serious medical issues or chronic medical problems, these are probably not your best option.  No Primary Care Doctor: - Call Health Connect at  [phone] - they can help you locate a primary care doctor that  accepts your insurance, provides certain services, etc. - Physician Referral Service- [phone]  Chronic Pain Problems: Organization         Address  Phone   Notes  [facility]  [phone] Patients need to be referred by their  primary care doctor.   Medication Assistance: Organization         Address  Phone   Notes  [facility] [address] [place], [place] [address] [phone] --Must be a resident of [facility] -- Must have NO insurance coverage whatsoever (no Medicaid/ Medicare, etc.) -- The pt. MUST have a primary care doctor that directs their care regularly and follows them in the community   [facility]  [phone]   [employer]  [phone]    Agencies that provide inexpensive medical care: Organization         Address  Phone   Notes  [facility]  [phone]   [facility]    [phone]   [facility] [address] [place], [place] [address] [phone]   [facility] [address], [place] [phone]   Planned Parenthood    [phone]   [facility]    [phone]   Community [facility] and [facility]  [address], [place] Phone:  [phone], Fax:  [phone] Hours of Operation:  9 am - 6 pm, M-F.  Also accepts Medicaid/Medicare and self-pay.  [facility]  [address], [place] Phone: [phone], Fax: [phone]. Hours of Operation:  8:30 am - 5:30 pm, M-F.  Also accepts Medicaid and self-pay.  [facility] [place] [address] [place] Phone: [phone]   [facility] [address] Natasha Bence [place], [place] [phone], Ext. [phone] Mondays & Thursdays: 7-9 AM.  First 15 patients are seen on a first come, first serve basis.    Medicaid-accepting [facility] Providers:  Organization         Address  Phone   Notes  [facility] [address], [place] [phone] Also accepts self-pay patients.  [facility] [address] Laurell Josephs [place], [place]  [phone]   [facility] [address], [place] [phone]   [facility] [address] [place] [phone]   Renaye Rakers [address], [place]   [phone] Only accepts [place] Access [place] patients after they have their name applied to their card.   Self-Pay (no insurance) in [facility]:  Organization         Address  Phone   Notes  Sickle Cell Patients, [facility] [address] [place], [place] [phone]   [facility]gent Care [address] [place], [place] [phone]   [facility] [place]  [address], [place] [phone]   [facility]/Dr. Osei-Bonsu  [address] [place] or [address], [place] [phone] Phone number for both [place] and [place] locations is the same.  [facility]gent [facility] and [facility] [place] [address]  [place] [phone]   [facility] [address], [place] or [address] [phone]   [facility] [address] [place] [phone], phone; [phone], fax Sees patients 1st and 3rd Saturday of every month.  Must not qualify for public or private insurance (i.e. Medicaid, Medicare, [place] Health Choice, Veterans' Benefits)  Household income should be no more than 200%  of the poverty level The clinic cannot treat you if you are pregnant or think you are pregnant  Sexually transmitted diseases are not treated at the clinic.    Dental Care: Organization         Address  Phone  Notes  [facility] [address], [place] [phone] Accepts children up to age 45 who are enrolled in [place] or [place] Health Choice; pregnant women with a Medicaid card; and children who have applied for Medicaid or [place] Health Choice, but were declined, whose parents can pay a reduced fee at time of service.  [facility] [place]  [address], [place] [phone] Accepts children up to age 221 who are enrolled in [place] or [place] Health Choice; pregnant women with a Medicaid card; and children who have applied for Medicaid or [place] Health Choice, but were declined, whose parents can pay a reduced fee at time of service.  [facility]  [address], [place] [phone] Patients are seen by appointment only. Walk-ins are not accepted. [facility] will see patients 45 years of age and older. Monday - Tuesday (8am-5pm) Most Wednesdays (8:30-5pm) $30 per visit, cash only  [facility]  [address], [facility] [phone] Patients are seen by appointment only. Walk-ins are not accepted. [facility] will see patients 45 years of age and older. One Wednesday Evening  (Monthly: Volunteer Based).  $30 per visit, cash only  [facility]  [phone] for adults; Children under age 84, call Graduate Pediatric Dentistry at [phone]. Children aged 334-14, please call [phone] to request a pediatric application.  Dental services are provided in all areas of dental care including fillings, crowns and bridges, complete and partial dentures, implants, gum treatment, root canals, and extractions. Preventive care is also provided. Treatment is provided to both adults and children. Patients are selected via a lottery and there is often a waiting list.   [facility] [address], [place]  [phone] [email or web address]   [facility] [address], [place], [place] [phone], Ext. [phone] Second and Fourth Thursday of each month, opens at 6:30 AM; Clinic ends at 9 AM.  Patients are seen on a first-come first-served basis, and a limited number are seen during each clinic.   [facility]  [address], [place], [place] [phone]) [phone]   Eligibility Requirements You must have lived in [place], [place], or [place] counties for at least the last three months.   You cannot be eligible for state or federal sponsored [place] Cityhealthcare insurance, including CIGNAVeterans Administration, [place]icaid, or [employer]icare.   You generally cannot be eligible for healthcare insurance through your employer.    How to apply: Eligibility screenings are held every Tuesday and Wednesday afternoon from 1:00 pm until 4:00 pm. You do not need an appointment for the interview!  [facility] [address], [place], [place] [phone]   [facility]  [phone]   [facility]  [phone]   [facility]  [phone]    Behavioral Health Resources in the Community: Intensive Outpatient Programs Organization         Address  Phone  Notes  [facility]  [facility] Services [address], [place], [place] [phone]   [facility] Out[facility]  [address], [place], [place] [phone]   [facility] [address], [place], [place]  [phone]   [facility] [address]  [place], [place] [phone] or [phone]   Substance Abuse Resources Organization         Address  Phone  Notes  Alcohol [facility] Drug Services  [phone]   [facility]  [phone]   [facility] [place]  [phone]   Floydene Flock  [phone]   Residential & Outpatient Substance Abuse Program  [phone]   Psychological Services Organization         Address  Phone  Notes  [facility]  [phone]   [facility]  [phone]   [facility] Mental Health [address], [place] [phone] or [phone]    Mobile Crisis Teams Organization         Address  Phone  Notes  [facility], Mobile Crisis Care Unit  [phone]   [facility]  [address] [place], [place] [phone]   Doristine Locks [address] [place] [phone]    Self-Help/Support Groups Organization         Address  Phone             Notes  [facility] [place] - variety of support groups  [phone]- I7437963 Call for more information  Narcotics Anonymous (NA), Caring [facility] [address], [employer] [place]  2 meetings at this location   Statistician         Address  Phone  Notes  [facility] [address],    [place]  [phone]   [facility]  [address], [place] [address], [place], [place] [phone]   [facility] [address] [place], [place] [phone] Admissions: 8am-3pm M-F  [facility] [address],    [place], [place] [phone]   [facility] [address] [place],  [place], [place] [phone]   [facility] [address],  [place], [place] [phone]   [facility] - Intensive Outpatient [address]ure[address], [place], [place] [phone]   [facility] ([facility] [address]  [place], [place] [phone] or [phone]   [facility] ([facility]) [address] [place], [place] [phone] Accepts Medicaid  [facility] [address]  [place] [phone] Substance Abuse/Addiction Treatment   [facility] Organization         Address  Phone  Notes  [facility]  [phone]   Angie Fava, PhD [address] Ervin Knack [place], [place]   [phone] or [phone]   [facility]   [address] [place], [place] [phone]   [facility] [address] [place], [place] [phone] Insurance/Medicaid/sponsorship through [facility] [address]                                    [place], [place] [phone] Therapy/tele-psych/case  [facility] [address], [place] [phone]    Dr. Lolly Mustache  [phone]   [facility] [place]  [employer] [facility] Dept. 1) [address], [place] 2) [address] [place] 3)  [address],  [place] [phone]  [phone]   [place] [phone] or [phone] (After Hours)

## 2013-07-14 NOTE — ED Provider Notes (Signed)
CSN: [id number]     Arrival date & time 07/14/13  1515 History   First MD Initiated Contact with Patient 07/14/13 1543     Chief Complaint  Patient presents with  . Chest Pain     (Consider location/radiation/quality/duration/timing/severity/associated sxs/prior Treatment) The history is provided by the patient.    Patient presents with central chest pain and soreness that began at 4:30am as it woke her from sleep.  Pain is severe, 10/10, worse with palpation, movement, deep inspiration.  The pain is constant.  Denies fevers, cough, recent illness, hemoptysis, leg swelling, recent immobilization, exogenous estrogen, family or personal hx blood clot.  Denies abdominal pain.  No personal or family hx CAD.  Family hx afib.  Denies heavy lifting, trauma to chest, recent falls or accidents.    Past Medical History  Diagnosis Date  . Migraines    Past Surgical History  Procedure Laterality Date  . Abdominal hysterectomy    . Hernia repair    . Colonoscopy N/A 07/11/2012    Procedure: COLONOSCOPY;  Surgeon: Barrie Folk, MD;  Location: [facility];  Service: Endoscopy;  Laterality: N/A;   No family history on file. History  Substance Use Topics  . Smoking status: Never Smoker   . Smokeless tobacco: Not on file  . Alcohol Use: 1.8 oz/week    3 Glasses of wine per week   OB History   Grav Para Term Preterm Abortions TAB SAB Ect Mult Living                 Review of Systems  All other systems reviewed and are negative.     Allergies  Aspirin  Home Medications   Prior to Admission medications   Medication Sig Start Date End Date Taking? Authorizing Provider  ciprofloxacin (CIPRO) 500 MG tablet Take 1 tablet (500 mg total) by mouth 2 (two) times daily. 05/27/12   Marinda Elk, MD  fluconazole (DIFLUCAN) 100 MG tablet Take 1 tablet (100 mg total) by mouth daily. 05/27/12   Marinda Elk, MD  HYDROcodone-acetaminophen (NORCO/VICODIN) 5-325 MG per tablet Take 2  tablets by mouth every 4 (four) hours as needed. 05/27/12   Marinda Elk, MD  ondansetron (ZOFRAN) 4 MG tablet Take 1 tablet (4 mg total) by mouth every 8 (eight) hours as needed for nausea. 05/27/12   Marinda Elk, MD   BP 106/62  Pulse 91  Temp(Src) 97.8 F (36.6 C) (Oral)  Resp 20  Ht 5\' 4"  (1.626 m)  Wt 200 lb 1 oz (90.748 kg)  BMI 34.32 kg/m2  SpO2 97% Physical Exam  Nursing note and vitals reviewed. Constitutional: She appears well-developed and well-nourished. No distress.  HENT:  Head: Normocephalic and atraumatic.  Neck: Neck supple.  Cardiovascular: Normal rate and regular rhythm.   Pulmonary/Chest: Effort normal and breath sounds normal. No respiratory distress. She has no wheezes. She has no rales. She exhibits tenderness (reproduces pain).    Pt refuses to lie flat for exam and also refuses taking her own shirt off or putting on gown as any movement exacerbates her pain.   Abdominal: Soft. She exhibits no distension. There is no tenderness. There is no rebound and no guarding.  Neurological: She is alert.  Skin: She is not diaphoretic.    ED Course  Procedures (including critical care time) Labs Review Labs Reviewed  COMPREHENSIVE METABOLIC PANEL - Abnormal; Notable for the following:    Sodium 136 (*)    GFR calc non  Af Amer 70 (*)    GFR calc Af Amer 81 (*)    All other components within normal limits  CBC WITH DIFFERENTIAL  PRO B NATRIURETIC PEPTIDE  I-STAT TROPOININ, ED    Imaging Review Dg Chest 2 View  07/14/2013   CLINICAL DATA:  Chest pain  EXAM: CHEST  2 VIEW  COMPARISON:  Chest radiograph 02/07/2012  FINDINGS: Normal cardiac silhouette. Is mild central venous poor congestion. No effusion, infiltrate, or pneumothorax.  IMPRESSION: Mild central venous pulmonary congestion.   Electronically Signed   By: Genevive BiStewart  Edmunds M.D.   On: 07/14/2013 16:59     EKG Interpretation   Date/Time:  Sunday Jul 14 2013 15:23:57 EDT Ventricular Rate:   77 PR Interval:  118 QRS Duration: 72 QT Interval:  368 QTC Calculation: 416 R Axis:     Text Interpretation:  Normal sinus rhythm with sinus arrhythmia Normal ECG  Simlar to prior Confirmed by [facility]  MD, BLAIR ([id number]) on 07/14/2013 4:54:24  PM      Filed Vitals:   07/14/13 1526  BP: 106/62  Pulse: 91  Temp: 97.8 F (36.6 C)  Resp: 20    5:31 PM Discussed pt with Dr Gwendolyn GrantWalden   MDM   Final diagnoses:  Chest wall pain  Costochondritis   Pt with central chest pain reproducible to palpation.  Workup unremarkable.  Likely costochondritis.  Discussed patient and all results with Dr Gwendolyn GrantWalden.  Discussed result, findings, treatment, and follow up  with patient.  Pt given return precautions.  Pt verbalizes understanding and agrees with plan.      Wells criteria for PE score is zero.  PERC negative.  HEART score 0.   Trixie Dredgemily Myrta Mercer, PA-C 07/14/13 2120

## 2013-07-16 NOTE — ED Provider Notes (Signed)
Medical screening examination/treatment/procedure(s) were conducted as a shared visit with non-physician practitioner(s) and myself.  I personally evaluated the patient during the encounter.   EKG Interpretation   Date/Time:  Sunday Jul 14 2013 15:23:57 EDT Ventricular Rate:  77 PR Interval:  118 QRS Duration: 72 QT Interval:  368 QTC Calculation: 416 R Axis:     Text Interpretation:  Normal sinus rhythm with sinus arrhythmia Normal ECG  Simlar to prior Confirmed by Gwendolyn Grant  MD, Ashaunti Treptow ([id number]) on 07/14/2013 4:54:24  PM      Patient here with chest pain - reproducible pain in center of chest, pain with movement. Exam and history c/w costochondritis.  Dagmar Hait, MD 07/16/13 0730

## 2013-10-05 ENCOUNTER — Encounter (HOSPITAL_COMMUNITY): Payer: Self-pay | Admitting: Emergency Medicine

## 2013-10-05 ENCOUNTER — Emergency Department (HOSPITAL_COMMUNITY)
Admission: EM | Admit: 2013-10-05 | Discharge: 2013-10-05 | Disposition: A | Discharge status: Home / Self Care | Payer: No Typology Code available for payment source | Attending: Emergency Medicine | Admitting: Emergency Medicine

## 2013-10-05 DIAGNOSIS — J3489 Other specified disorders of nose and nasal sinuses: Secondary | ICD-10-CM | POA: Diagnosis present

## 2013-10-05 DIAGNOSIS — B9789 Other viral agents as the cause of diseases classified elsewhere: Secondary | ICD-10-CM

## 2013-10-05 DIAGNOSIS — H9209 Otalgia, unspecified ear: Secondary | ICD-10-CM | POA: Diagnosis not present

## 2013-10-05 DIAGNOSIS — Z8679 Personal history of other diseases of the circulatory system: Secondary | ICD-10-CM | POA: Insufficient documentation

## 2013-10-05 DIAGNOSIS — J329 Chronic sinusitis, unspecified: Secondary | ICD-10-CM | POA: Diagnosis not present

## 2013-10-05 DIAGNOSIS — J309 Allergic rhinitis, unspecified: Secondary | ICD-10-CM | POA: Insufficient documentation

## 2013-10-05 DIAGNOSIS — H748X9 Other specified disorders of middle ear and mastoid, unspecified ear: Secondary | ICD-10-CM | POA: Diagnosis not present

## 2013-10-05 DIAGNOSIS — R51 Headache: Secondary | ICD-10-CM | POA: Diagnosis not present

## 2013-10-05 MED ORDER — FLUTICASONE PROPIONATE 50 MCG/ACT NA SUSP: 2.0000 | Freq: Every day | NASAL | Status: DC

## 2013-10-05 MED ORDER — MUPIROCIN 2 % EX OINT
1.0000 | TOPICAL_OINTMENT | Freq: Two times a day (BID) | CUTANEOUS | Status: DC
Start: 2013-10-05 — End: 2014-05-22

## 2013-10-05 MED ORDER — OXYMETAZOLINE HCL 0.05 % NA SOLN
1.0000 | Freq: Once | NASAL | Status: AC
  Administered 2013-10-05: 1 via NASAL
  Filled 2013-10-05: qty 15

## 2013-10-05 MED ORDER — IBUPROFEN 800 MG PO TABS: 800.0000 mg | ORAL_TABLET | Freq: Three times a day (TID) | ORAL | Status: DC

## 2013-10-05 NOTE — Discharge Instructions (Signed)
1. Medications: Flonase, mucinex, Bactroban, usual home medications 2. Treatment: rest, drink plenty of fluids, take tylenol or ibuprofen for fever control 3. Follow Up: Please followup with your primary doctor for discussion of your diagnoses and further evaluation after today's visit; if you do not have a primary care doctor use the resource guide provided to find one;    Allergic Rhinitis Allergic rhinitis is when the mucous membranes in the nose respond to allergens. Allergens are particles in the air that cause your body to have an allergic reaction. This causes you to release allergic antibodies. Through a chain of events, these eventually cause you to release histamine into the blood stream. Although meant to protect the body, it is this release of histamine that causes your discomfort, such as frequent sneezing, congestion, and an itchy, runny nose.  CAUSES  Seasonal allergic rhinitis (hay fever) is caused by pollen allergens that may come from grasses, trees, and weeds. Year-round allergic rhinitis (perennial allergic rhinitis) is caused by allergens such as house dust mites, pet dander, and mold spores.  SYMPTOMS   Nasal stuffiness (congestion).  Itchy, runny nose with sneezing and tearing of the eyes. DIAGNOSIS  Your health care provider can help you determine the allergen or allergens that trigger your symptoms. If you and your health care provider are unable to determine the allergen, skin or blood testing may be used. TREATMENT  Allergic rhinitis does not have a cure, but it can be controlled by:  Medicines and allergy shots (immunotherapy).  Avoiding the allergen. Hay fever may often be treated with antihistamines in pill or nasal spray forms. Antihistamines block the effects of histamine. There are over-the-counter medicines that may help with nasal congestion and swelling around the eyes. Check with your health care provider before taking or giving this medicine.  If avoiding  the allergen or the medicine prescribed do not work, there are many new medicines your health care provider can prescribe. Stronger medicine may be used if initial measures are ineffective. Desensitizing injections can be used if medicine and avoidance does not work. Desensitization is when a patient is given ongoing shots until the body becomes less sensitive to the allergen. Make sure you follow up with your health care provider if problems continue. HOME CARE INSTRUCTIONS It is not possible to completely avoid allergens, but you can reduce your symptoms by taking steps to limit your exposure to them. It helps to know exactly what you are allergic to so that you can avoid your specific triggers. SEEK MEDICAL CARE IF:   You have a fever.  You develop a cough that does not stop easily (persistent).  You have shortness of breath.  You start wheezing.  Symptoms interfere with normal daily activities. Document Released: 10/26/2000 Document Revised: 02/05/2013 Document Reviewed: 10/08/2012 [facility] Patient Information 2015 [place], [place]. This information is not intended to replace advice given to you by your health care provider. Make sure you discuss any questions you have with your health care provider.

## 2013-10-05 NOTE — ED Provider Notes (Signed)
CSN: [id number]     Arrival date & time 10/05/13  1809 History  This chart was scribed for non-physician practitioner working with Joya Gaskinsonald W Wickline, MD, by Modena JanskyAlbert Thayil, ED Scribe. This patient was seen in room TR04C/TR04C and the patient's care was started at 7:07 PM.   Chief Complaint  Patient presents with  . Nasal Congestion   The history is provided by the patient and medical records. No language interpreter was used.   HPI Comments: Mary [name] is a 43 y.o. female who presents to the Emergency Department complaining of nasal congestion that started 3 days ago. She reports associated swelling and pain to the left nare after scratching it. She states that her throat is irritated from postnasal drip. She states that she took zyrtec without relief. She reports that she took ibuprofen without relief. She states that she has ear pain. Patient denies fever, chills, neck pain and neck stiffness, chest pain, cough, shortness of breath, abdominal pain, nausea or vomiting, diarrhea, weakness, dizziness, syncope  Past Medical History  Diagnosis Date  . Migraines    Past Surgical History  Procedure Laterality Date  . Abdominal hysterectomy    . Hernia repair    . Colonoscopy N/A 07/11/2012    Procedure: COLONOSCOPY;  Surgeon: Barrie FolkJohn C Hayes, MD;  Location: [facility];  Service: Endoscopy;  Laterality: N/A;   No family history on file. History  Substance Use Topics  . Smoking status: Never Smoker   . Smokeless tobacco: Not on file  . Alcohol Use: 1.8 oz/week    3 Glasses of wine per week   OB History   Grav Para Term Preterm Abortions TAB SAB Ect Mult Living                 Review of Systems  Constitutional: Negative for fever, chills, appetite change and fatigue.  HENT: Positive for congestion, ear pain, postnasal drip, rhinorrhea, sinus pressure and sore throat. Negative for ear discharge and mouth sores.   Eyes: Negative for visual disturbance.  Respiratory: Negative for  cough, chest tightness, shortness of breath, wheezing and stridor.   Cardiovascular: Negative for chest pain, palpitations and leg swelling.  Gastrointestinal: Negative for nausea, vomiting, abdominal pain and diarrhea.  Genitourinary: Negative for dysuria, urgency, frequency and hematuria.  Musculoskeletal: Negative for arthralgias, back pain, myalgias and neck stiffness.  Skin: Negative for rash.  Neurological: Positive for headaches. Negative for syncope, light-headedness and numbness.  Hematological: Negative for adenopathy.  Psychiatric/Behavioral: The patient is not nervous/anxious.   All other systems reviewed and are negative.   Allergies  Aspirin  Home Medications   Prior to Admission medications   Medication Sig Start Date End Date Taking? Authorizing Provider  fluticasone (FLONASE) 50 MCG/ACT nasal spray Place 2 sprays into both nostrils daily. 10/05/13   Ozil Stettler, PA-C  HYDROcodone-acetaminophen (NORCO/VICODIN) 5-325 MG per tablet Take 1-2 tablets by mouth every 4 (four) hours as needed for moderate pain or severe pain. 07/14/13   Trixie DredgeEmily West, PA-C  ibuprofen (ADVIL,MOTRIN) 800 MG tablet Take 1 tablet (800 mg total) by mouth every 8 (eight) hours as needed for mild pain or moderate pain. 07/14/13   Trixie DredgeEmily West, PA-C  ibuprofen (ADVIL,MOTRIN) 800 MG tablet Take 1 tablet (800 mg total) by mouth 3 (three) times daily. 10/05/13   Mont Jagoda, PA-C  mupirocin ointment (BACTROBAN) 2 % Place 1 application into the nose 2 (two) times daily. 10/05/13   Gibson Telleria, PA-C  ondansetron (ZOFRAN) 4 MG tablet Take  1 tablet (4 mg total) by mouth every 8 (eight) hours as needed for nausea or vomiting. 07/14/13   Trixie Dredge, PA-C   BP 114/82  Pulse 100  Temp(Src) 98.2 F (36.8 C) (Oral)  Resp 22  SpO2 96% Physical Exam  Nursing note and vitals reviewed. Constitutional: She is oriented to person, place, and time. She appears well-developed and well-nourished. No  distress.  Awake, alert, nontoxic appearance  HENT:  Head: Normocephalic and atraumatic.  Right Ear: External ear and ear canal normal. A middle ear effusion is present.  Left Ear: External ear and ear canal normal. A middle ear effusion is present.  Nose: Mucosal edema and rhinorrhea present. No epistaxis. Right sinus exhibits no maxillary sinus tenderness and no frontal sinus tenderness. Left sinus exhibits no maxillary sinus tenderness and no frontal sinus tenderness.  Mouth/Throat: Uvula is midline, oropharynx is clear and moist and mucous membranes are normal. Mucous membranes are not pale and not cyanotic. No oropharyngeal exudate, posterior oropharyngeal edema, posterior oropharyngeal erythema or tonsillar abscesses.  Bilateral middle ear effusions with clear fluid, no purulent drainage, no erythema, no evidence of otitis media Small infected follicle noted to the inside of the left nare, draining.  No extending erythema, induration or evidence of cellulitis No polyp  Eyes: Conjunctivae are normal. Pupils are equal, round, and reactive to light. No scleral icterus.  Neck: Normal range of motion and full passive range of motion without pain. Neck supple.  Cardiovascular: Normal rate, regular rhythm, normal heart sounds and intact distal pulses.   No murmur heard. Pulmonary/Chest: Effort normal and breath sounds normal. No stridor. No respiratory distress. She has no wheezes.  Clear and equal breath sounds without focal wheezes, rhonchi, rales   Abdominal: Soft. Bowel sounds are normal. She exhibits no mass. There is no tenderness. There is no rebound and no guarding.  Musculoskeletal: Normal range of motion. She exhibits no edema.  Lymphadenopathy:    She has no cervical adenopathy.  Neurological: She is alert and oriented to person, place, and time.  Speech is clear and goal oriented Moves extremities without ataxia  Skin: Skin is warm and dry. No rash noted. She is not diaphoretic.   Psychiatric: She has a normal mood and affect.    ED Course  Procedures (including critical care time) DIAGNOSTIC STUDIES: Oxygen Saturation is 96% on RA, normal by my interpretation.    COORDINATION OF CARE: 7:11 PM- Pt advised of plan for treatment and pt agrees.  Labs Review Labs Reviewed - No data to display  Imaging Review No results found.   EKG Interpretation None      MDM   Final diagnoses:  Viral sinusitis  Allergic rhinitis, unspecified allergic rhinitis type   Mary [name] presents with allergic rhinitis and infected hair follicle of the left nare.  Patient complaining of symptoms of sinusitis.  Mild to moderate symptoms of clear/yellow nasal discharge/congestion and scratchy throat with cough for less than 10 days.  Patient is afebrile.  No concern for acute bacterial rhinosinusitis; likely viral in nature.  Patient discharged with symptomatic treatment.  Patient instructions given for warm saline nasal washes.  Recommendations for follow-up with primary care physician.    BP 114/82  Pulse 100  Temp(Src) 98.2 F (36.8 C) (Oral)  Resp 22  SpO2 96%  I personally performed the services described in this documentation, which was scribed in my presence. The recorded information has been reviewed and is accurate.   Dahlia Client Lanna Labella, PA-C 10/05/13  1931 

## 2013-10-05 NOTE — ED Notes (Signed)
The pt is c/o nasal congestion for 3 days.  She reports that her nasal passage i is totally blocked

## 2013-10-07 NOTE — ED Provider Notes (Signed)
Medical screening examination/treatment/procedure(s) were performed by non-physician practitioner and as supervising physician I was immediately available for consultation/collaboration.   EKG Interpretation None        Joya Gaskins, MD 10/07/13 [phone]

## 2014-02-04 ENCOUNTER — Ambulatory Visit: Payer: Self-pay | Admitting: Gastroenterology

## 2014-02-10 ENCOUNTER — Ambulatory Visit: Payer: Self-pay | Admitting: Gastroenterology

## 2014-02-12 ENCOUNTER — Emergency Department (HOSPITAL_COMMUNITY)
Admission: EM | Admit: 2014-02-12 | Discharge: 2014-02-12 | Disposition: A | Discharge status: Home / Self Care | Payer: No Typology Code available for payment source | Attending: Emergency Medicine | Admitting: Emergency Medicine

## 2014-02-12 ENCOUNTER — Emergency Department (HOSPITAL_COMMUNITY): Payer: No Typology Code available for payment source

## 2014-02-12 ENCOUNTER — Encounter (HOSPITAL_COMMUNITY): Payer: Self-pay | Admitting: *Deleted

## 2014-02-12 DIAGNOSIS — Z9889 Other specified postprocedural states: Secondary | ICD-10-CM | POA: Insufficient documentation

## 2014-02-12 DIAGNOSIS — R1011 Right upper quadrant pain: Secondary | ICD-10-CM

## 2014-02-12 DIAGNOSIS — R1010 Upper abdominal pain, unspecified: Secondary | ICD-10-CM | POA: Diagnosis present

## 2014-02-12 DIAGNOSIS — K529 Noninfective gastroenteritis and colitis, unspecified: Secondary | ICD-10-CM | POA: Insufficient documentation

## 2014-02-12 DIAGNOSIS — Z79899 Other long term (current) drug therapy: Secondary | ICD-10-CM | POA: Diagnosis not present

## 2014-02-12 DIAGNOSIS — G43909 Migraine, unspecified, not intractable, without status migrainosus: Secondary | ICD-10-CM | POA: Insufficient documentation

## 2014-02-12 DIAGNOSIS — Z90710 Acquired absence of both cervix and uterus: Secondary | ICD-10-CM | POA: Diagnosis not present

## 2014-02-12 DIAGNOSIS — R109 Unspecified abdominal pain: Secondary | ICD-10-CM

## 2014-02-12 DIAGNOSIS — Z3202 Encounter for pregnancy test, result negative: Secondary | ICD-10-CM | POA: Diagnosis not present

## 2014-02-12 LAB — URINALYSIS, ROUTINE W REFLEX MICROSCOPIC
BILIRUBIN URINE: NEGATIVE
GLUCOSE, UA: NEGATIVE mg/dL
Ketones, ur: 15 mg/dL — AB
LEUKOCYTES UA: NEGATIVE
NITRITE: NEGATIVE
Protein, ur: NEGATIVE mg/dL
Specific Gravity, Urine: 1.007 (ref 1.005–1.030)
UROBILINOGEN UA: 0.2 mg/dL (ref 0.0–1.0)
pH: 6 (ref 5.0–8.0)

## 2014-02-12 LAB — COMPREHENSIVE METABOLIC PANEL
ALK PHOS: 76 U/L (ref 39–117)
ALT: 27 U/L (ref 0–35)
ANION GAP: 7 (ref 5–15)
AST: 26 U/L (ref 0–37)
Albumin: 3.9 g/dL (ref 3.5–5.2)
BUN: 5 mg/dL — ABNORMAL LOW (ref 6–23)
CO2: 24 mmol/L (ref 19–32)
Calcium: 9.1 mg/dL (ref 8.4–10.5)
Chloride: 105 mEq/L (ref 96–112)
Creatinine, Ser: 1.12 mg/dL — ABNORMAL HIGH (ref 0.50–1.10)
GFR, EST AFRICAN AMERICAN: 68 mL/min — AB (ref >90)
GFR, EST NON AFRICAN AMERICAN: 58 mL/min — AB (ref >90)
Glucose, Bld: 94 mg/dL (ref 70–99)
Potassium: 3.6 mmol/L (ref 3.5–5.1)
Sodium: 136 mmol/L (ref 135–145)
Total Bilirubin: 0.7 mg/dL (ref 0.3–1.2)
Total Protein: 7.4 g/dL (ref 6.0–8.3)

## 2014-02-12 LAB — CBC WITH DIFFERENTIAL/PLATELET
BASOS ABS: 0 10*3/uL (ref 0.0–0.1)
Basophils Relative: 0 % (ref 0–1)
EOS PCT: 1 % (ref 0–5)
Eosinophils Absolute: 0.1 10*3/uL (ref 0.0–0.7)
HEMATOCRIT: 39.7 % (ref 36.0–46.0)
Hemoglobin: 13.2 g/dL (ref 12.0–15.0)
Lymphocytes Relative: 24 % (ref 12–46)
Lymphs Abs: 2 10*3/uL (ref 0.7–4.0)
MCH: 30.8 pg (ref 26.0–34.0)
MCHC: 33.2 g/dL (ref 30.0–36.0)
MCV: 92.8 fL (ref 78.0–100.0)
Monocytes Absolute: 0.7 10*3/uL (ref 0.1–1.0)
Monocytes Relative: 8 % (ref 3–12)
Neutro Abs: 5.7 10*3/uL (ref 1.7–7.7)
Neutrophils Relative %: 67 % (ref 43–77)
Platelets: 217 10*3/uL (ref 150–400)
RBC: 4.28 MIL/uL (ref 3.87–5.11)
RDW: 14.2 % (ref 11.5–15.5)
WBC: 8.5 10*3/uL (ref 4.0–10.5)

## 2014-02-12 LAB — URINE MICROSCOPIC-ADD ON

## 2014-02-12 LAB — LIPASE, BLOOD: LIPASE: 25 U/L (ref 11–59)

## 2014-02-12 LAB — PREGNANCY, URINE: Preg Test, Ur: NEGATIVE

## 2014-02-12 MED ORDER — IOHEXOL 300 MG/ML  SOLN
25.0000 mL | INTRAMUSCULAR | Status: AC
  Administered 2014-02-12: 25 mL via ORAL

## 2014-02-12 MED ORDER — HYDROMORPHONE HCL 1 MG/ML IJ SOLN
1.0000 mg | Freq: Once | INTRAMUSCULAR | Status: AC
  Administered 2014-02-12: 1 mg via INTRAVENOUS
  Filled 2014-02-12: qty 1

## 2014-02-12 MED ORDER — ONDANSETRON HCL 4 MG PO TABS: 4.0000 mg | ORAL_TABLET | Freq: Four times a day (QID) | ORAL | Status: DC

## 2014-02-12 MED ORDER — CIPROFLOXACIN HCL 500 MG PO TABS: 500.0000 mg | ORAL_TABLET | Freq: Two times a day (BID) | ORAL | Status: DC

## 2014-02-12 MED ORDER — MORPHINE SULFATE 4 MG/ML IJ SOLN
4.0000 mg | Freq: Once | INTRAMUSCULAR | Status: AC
  Administered 2014-02-12: 4 mg via INTRAMUSCULAR
  Filled 2014-02-12: qty 1

## 2014-02-12 MED ORDER — CIPROFLOXACIN HCL 500 MG PO TABS
500.0000 mg | ORAL_TABLET | Freq: Once | ORAL | Status: AC
  Administered 2014-02-12: 500 mg via ORAL
  Filled 2014-02-12: qty 1

## 2014-02-12 MED ORDER — METRONIDAZOLE 500 MG PO TABS
500.0000 mg | ORAL_TABLET | Freq: Once | ORAL | Status: AC
  Administered 2014-02-12: 500 mg via ORAL
  Filled 2014-02-12: qty 1

## 2014-02-12 MED ORDER — MORPHINE SULFATE 4 MG/ML IJ SOLN: 4.0000 mg | Freq: Once | INTRAMUSCULAR | Status: DC

## 2014-02-12 MED ORDER — LORAZEPAM 2 MG/ML IJ SOLN
1.0000 mg | Freq: Once | INTRAMUSCULAR | Status: AC
  Administered 2014-02-12: 1 mg via INTRAMUSCULAR
  Filled 2014-02-12: qty 1

## 2014-02-12 MED ORDER — IOHEXOL 300 MG/ML  SOLN
100.0000 mL | Freq: Once | INTRAMUSCULAR | Status: AC | PRN
  Administered 2014-02-12: 100 mL via INTRAVENOUS

## 2014-02-12 MED ORDER — METRONIDAZOLE 500 MG PO TABS: 500.0000 mg | ORAL_TABLET | Freq: Three times a day (TID) | ORAL | Status: DC

## 2014-02-12 MED ORDER — SODIUM CHLORIDE 0.9 % IV BOLUS (SEPSIS)
1000.0000 mL | Freq: Once | INTRAVENOUS | Status: AC
  Administered 2014-02-12: 1000 mL via INTRAVENOUS

## 2014-02-12 MED ORDER — FLUCONAZOLE 200 MG PO TABS: 200.0000 mg | ORAL_TABLET | Freq: Every day | ORAL | Status: AC

## 2014-02-12 MED ORDER — ONDANSETRON HCL 4 MG/2ML IJ SOLN
4.0000 mg | Freq: Once | INTRAMUSCULAR | Status: AC
  Administered 2014-02-12: 4 mg via INTRAVENOUS
  Filled 2014-02-12: qty 2

## 2014-02-12 MED ORDER — OXYCODONE-ACETAMINOPHEN 5-325 MG PO TABS: 1.0000 | ORAL_TABLET | ORAL | Status: DC | PRN

## 2014-02-12 MED ORDER — PANTOPRAZOLE SODIUM 40 MG IV SOLR
40.0000 mg | Freq: Once | INTRAVENOUS | Status: AC
  Administered 2014-02-12: 40 mg via INTRAVENOUS
  Filled 2014-02-12: qty 40

## 2014-02-12 NOTE — ED Provider Notes (Signed)
CSN: [id number]     Arrival date & time 02/12/14  1220 History   First MD Initiated Contact with Patient 02/12/14 1237     Chief Complaint  Patient presents with  . Abdominal Pain     (Consider location/radiation/quality/duration/timing/severity/associated sxs/prior Treatment) HPI Comments: Patient presents with severe upper abdominal pain that she's had for several months but worse in the past 2 days. She underwent an EGD one week ago at [facility] and was told she had gastritis and esophagitis. She underwent an ultrasound of her gallbladder 2 days ago and was told she needed to have it removed. She endorses constant pain that is worse with palpation or movement. She's had 3 episodes of vomiting in the past 2 days that are nonbloody. She endorses diarrhea with some green discoloration and blood with wiping. She was told she might have irritable bowel syndrome. She denies any fevers, chills, dysuria, hematuria, vaginal bleeding or discharge. No chest pain or shortness of breath. No back pain.  The history is provided by the patient. The history is limited by the condition of the patient.    Past Medical History  Diagnosis Date  . Migraines    Past Surgical History  Procedure Laterality Date  . Abdominal hysterectomy    . Hernia repair    . Colonoscopy N/A 07/11/2012    Procedure: COLONOSCOPY;  Surgeon: Barrie FolkJohn C Hayes, MD;  Location: [facility];  Service: Endoscopy;  Laterality: N/A;   History reviewed. No pertinent family history. History  Substance Use Topics  . Smoking status: Never Smoker   . Smokeless tobacco: Not on file  . Alcohol Use: 1.8 oz/week    3 Glasses of wine per week   OB History    No data available     Review of Systems  Constitutional: Positive for activity change and appetite change. Negative for fever.  HENT: Negative for congestion and rhinorrhea.   Respiratory: Negative for cough, chest tightness and shortness of breath.   Cardiovascular:  Negative for chest pain.  Gastrointestinal: Positive for nausea, vomiting, abdominal pain, diarrhea and blood in stool.  Genitourinary: Negative for dysuria, hematuria, vaginal bleeding and vaginal discharge.  Musculoskeletal: Negative for myalgias, back pain, arthralgias and neck pain.  Skin: Negative for rash.  Neurological: Negative for dizziness, weakness, light-headedness and headaches.  A complete 10 system review of systems was obtained and all systems are negative except as noted in the HPI and PMH.      Allergies  Aspirin  Home Medications   Prior to Admission medications   Medication Sig Start Date End Date Taking? Authorizing Provider  eletriptan (RELPAX) 20 MG tablet Take 20 mg by mouth every 2 (two) hours as needed for migraine or headache. One tablet by mouth at onset of headache. May repeat in 2 hours if headache persists or recurs.   Yes Historical Provider, MD  LINZESS 290 MCG CAPS capsule Take 290 mcg by mouth daily.  01/21/14  Yes Historical Provider, MD  pantoprazole (PROTONIX) 40 MG tablet Take 40 mg by mouth 2 (two) times daily.  02/04/14  Yes Historical Provider, MD  sucralfate (CARAFATE) 1 G tablet Take 1 g by mouth 4 (four) times daily.  02/04/14  Yes Historical Provider, MD  traMADol (ULTRAM) 50 MG tablet Take 50 mg by mouth at bedtime as needed (for migraines).   Yes Historical Provider, MD  varenicline (CHANTIX) 1 MG tablet Take 1 mg by mouth 2 (two) times daily.   Yes Historical Provider, MD  CHANTIX STARTING MONTH PAK 0.5 MG X 11 & 1 MG X 42 tablet Take 1 mg by mouth 2 (two) times daily.  02/10/14   Historical Provider, MD  fluticasone (FLONASE) 50 MCG/ACT nasal spray Place 2 sprays into both nostrils daily. Patient not taking: Reported on 02/12/2014 10/05/13   Dahlia ClientHannah Muthersbaugh, PA-C  ibuprofen (ADVIL,MOTRIN) 800 MG tablet Take 1 tablet (800 mg total) by mouth 3 (three) times daily. Patient not taking: Reported on 02/12/2014 10/05/13   Dahlia ClientHannah Muthersbaugh,  PA-C  mupirocin ointment (BACTROBAN) 2 % Place 1 application into the nose 2 (two) times daily. Patient not taking: Reported on 02/12/2014 10/05/13   Dahlia ClientHannah Muthersbaugh, PA-C   BP 114/58 mmHg  Pulse 79  Temp(Src) 98.7 F (37.1 C) (Oral)  Resp 22  Ht 5\' 5"  (1.651 m)  Wt 200 lb (90.719 kg)  BMI 33.28 kg/m2  SpO2 99% Physical Exam  Constitutional: She is oriented to person, place, and time. She appears well-developed and well-nourished. She appears distressed.  Tearful anxious  HENT:  Head: Normocephalic and atraumatic.  Mouth/Throat: Oropharynx is clear and moist. No oropharyngeal exudate.  Eyes: Conjunctivae and EOM are normal. Pupils are equal, round, and reactive to light.  Neck: Normal range of motion. Neck supple.  No meningismus.  Cardiovascular: Normal rate, regular rhythm, normal heart sounds and intact distal pulses.   No murmur heard. Pulmonary/Chest: Effort normal and breath sounds normal. No respiratory distress.  Abdominal: Soft. There is tenderness. There is guarding. There is no rebound.  TTP epigastric and RUQ pain  Musculoskeletal: Normal range of motion. She exhibits no edema or tenderness.  Neurological: She is alert and oriented to person, place, and time. No cranial nerve deficit. She exhibits normal muscle tone. Coordination normal.  No ataxia on finger to nose bilaterally. No pronator drift. 5/5 strength throughout. CN 2-12 intact. Negative Romberg. Equal grip strength. Sensation intact. Gait is normal.   Skin: Skin is warm.  Psychiatric: She has a normal mood and affect. Her behavior is normal.  Nursing note and vitals reviewed.   ED Course  Procedures (including critical care time) Labs Review Labs Reviewed  URINALYSIS, ROUTINE W REFLEX MICROSCOPIC - Abnormal; Notable for the following:    Hgb urine dipstick TRACE (*)    Ketones, ur 15 (*)    All other components within normal limits  COMPREHENSIVE METABOLIC PANEL - Abnormal; Notable for the  following:    BUN 5 (*)    Creatinine, Ser 1.12 (*)    GFR calc non Af Amer 58 (*)    GFR calc Af Amer 68 (*)    All other components within normal limits  URINE MICROSCOPIC-ADD ON - Abnormal; Notable for the following:    Squamous Epithelial / LPF FEW (*)    Bacteria, UA FEW (*)    All other components within normal limits  PREGNANCY, URINE  CBC WITH DIFFERENTIAL  LIPASE, BLOOD    Imaging Review Ct Abdomen Pelvis Wo Contrast  02/12/2014   CLINICAL DATA:  Severe abdominal pain for several days.  EXAM: CT ABDOMEN AND PELVIS WITHOUT CONTRAST  TECHNIQUE: Multidetector CT imaging of the abdomen and pelvis was performed following the standard protocol without IV contrast.  COMPARISON:  05/23/2012  FINDINGS: The lung bases are clear.  No renal, ureteral, or bladder calculi. No obstructive uropathy. No perinephric stranding is seen. The kidneys are symmetric in size without evidence for exophytic mass. There is high-density material in the renal collecting system bilaterally which may reflect vicarious  excretion of oral contrast. The bladder is unremarkable.  The liver demonstrates no focal abnormality. The gallbladder is unremarkable. The spleen demonstrates no focal abnormality. The adrenal glands and pancreas are normal.  There is no bowel dilatation to suggest obstruction. There is no extravasation of contrast from the bowel. There is bowel wall thickening involving the cecum and proximal ascending colon with mild hazy inflammatory changes in the surrounding fat concerning for colitis secondary to an infectious or inflammatory etiology. There is mild relative bowel wall thickening of the descending colon which likely secondary to under distention given the lack of surrounding inflammatory changes. There is a normal caliber appendix in the right lower quadrant without periappendiceal inflammatory changes. There is no pneumoperitoneum, pneumatosis, or portal venous gas. There is no abdominal or pelvic  free fluid. There is no lymphadenopathy.  The abdominal aorta is normal in caliber.  The osseous structures are unremarkable.  IMPRESSION: 1. There is bowel wall thickening involving the cecum and proximal ascending colon with mild hazy inflammatory changes in the surrounding fat concerning for colitis secondary to an infectious or inflammatory etiology.   Electronically Signed   By: Elige Ko   On: 02/12/2014 17:46   Dg Chest 2 View  02/12/2014   CLINICAL DATA:  Right lower chest pain. Symptoms for months. Patient recently told motor gallbladder needs to be removed. Nausea, vomiting, bloody stools. History of smoking.  EXAM: CHEST  2 VIEW  COMPARISON:  07/14/2013  FINDINGS: Heart size is normal. There is right lower lobe atelectasis. No focal consolidations or pleural effusions are identified. Surgical clips are noted in the upper abdomen, likely in the epigastric region based on prior studies. Visualized osseous structures have a normal appearance.  IMPRESSION: Right lower lobe atelectasis.   Electronically Signed   By: Rosalie Gums M.D.   On: 02/12/2014 14:35     EKG Interpretation None      MDM   Final diagnoses:  RUQ pain  Abdominal pain   Patient with severe abdominal pain ongoing issue but worse in the past several days. She has had diarrhea ever since her HIDA scan 2 days ago.  Recent EGD, ultrasound, HIDA scan.  Records obtained from [place]. Patient had right upper quadrant ultrasound on December 28 which showed no gallstones or gallbladder wall thickening. Common bile duct 3.9 mm. HIDA scan done on December 28 showed normal gallbladder ejection fraction and was interpreted as a normal study.  EGD 12/22 showed gastritis and esophagitis and patent Schatzki's ring.  Patient started on protonix and carafate which she states she is taking.  She was also started on linzess for possible IBS.  UA negative.  CBC normal.  LFTs and lipase normal.  Normal colonoscopy in 2014. No recent CT  so will obtain as otherwise workup has been unrevealing.  Given negative workup, suspect pain due to IBS. CT pending at time of sign out to Dr. Juleen China.     Glynn Octave, MD 02/12/14 [phone]

## 2014-02-12 NOTE — ED Notes (Signed)
Pt c/o of pain at IV site. Site assessed, blood return noted, and IV flushes with no difficulty. There is no evidence of infiltration upon flushing site, other than the pt c/o of the site burning while being flushed and when palpated.

## 2014-02-12 NOTE — ED Notes (Signed)
IV team at bedside 

## 2014-02-12 NOTE — ED Notes (Signed)
Pt reports having severe abd pain for several days, had US done on 12/28 and was told she needs to have gallbladder removed. Pt reports right side abd pain, n/v and blood in her stools. Pt tearful at triage.

## 2014-02-12 NOTE — ED Notes (Signed)
Pt escorted off unit to vehicle via wheelchair.

## 2014-02-12 NOTE — Discharge Instructions (Signed)
Abdominal Pain Your gallbladder is normal. Continue your stomach medicine and follow up with Dr. Marva PandaSkulskie. Return to the ED if you develop new or worsening symptoms. Many things can cause abdominal pain. Usually, abdominal pain is not caused by a disease and will improve without treatment. It can often be observed and treated at home. Your health care provider will do a physical exam and possibly order blood tests and X-rays to help determine the seriousness of your pain. However, in many cases, more time must pass before a clear cause of the pain can be found. Before that point, your health care provider may not know if you need more testing or further treatment. HOME CARE INSTRUCTIONS  Monitor your abdominal pain for any changes. The following actions may help to alleviate any discomfort you are experiencing:  Only take over-the-counter or prescription medicines as directed by your health care provider.  Do not take laxatives unless directed to do so by your health care provider.  Try a clear liquid diet (broth, tea, or water) as directed by your health care provider. Slowly move to a bland diet as tolerated. SEEK MEDICAL CARE IF:  You have unexplained abdominal pain.  You have abdominal pain associated with nausea or diarrhea.  You have pain when you urinate or have a bowel movement.  You experience abdominal pain that wakes you in the night.  You have abdominal pain that is worsened or improved by eating food.  You have abdominal pain that is worsened with eating fatty foods.  You have a fever. SEEK IMMEDIATE MEDICAL CARE IF:   Your pain does not go away within 2 hours.  You keep throwing up (vomiting).  Your pain is felt only in portions of the abdomen, such as the right side or the left lower portion of the abdomen.  You pass bloody or black tarry stools. MAKE SURE YOU:  Understand these instructions.   Will watch your condition.   Will get help right away if you are  not doing well or get worse.  Document Released: 11/10/2004 Document Revised: 02/05/2013 Document Reviewed: 10/10/2012 [facility] Patient Information 2015 [employer], [place]. This information is not intended to replace advice given to you by your health care provider. Make sure you discuss any questions you have with your health care provider.  Colitis Colitis is inflammation of the colon. Colitis can be a short-term or long-standing (chronic) illness. Crohn's disease and ulcerative colitis are 2 types of colitis which are chronic. They usually require lifelong treatment. CAUSES  There are many different causes of colitis, including:  Viruses.  Germs (bacteria).  Medicine reactions. SYMPTOMS   Diarrhea.  Intestinal bleeding.  Pain.  Fever.  Throwing up (vomiting).  Tiredness (fatigue).  Weight loss.  Bowel blockage. DIAGNOSIS  The diagnosis of colitis is based on examination and stool or blood tests. X-rays, CT scan, and colonoscopy may also be needed. TREATMENT  Treatment may include:  Fluids given through the vein (intravenously).  Bowel rest (nothing to eat or drink for a period of time).  Medicine for pain and diarrhea.  Medicines (antibiotics) that kill germs.  Cortisone medicines.  Surgery. HOME CARE INSTRUCTIONS   Get plenty of rest.  Drink enough water and fluids to keep your urine clear or pale yellow.  Eat a well-balanced diet.  Call your caregiver for follow-up as recommended. SEEK IMMEDIATE MEDICAL CARE IF:   You develop chills.  You have an oral temperature above 102 F (38.9 C), not controlled by medicine.  You have extreme weakness, fainting, or dehydration.  You have repeated vomiting.  You develop severe belly (abdominal) pain or are passing bloody or tarry stools. MAKE SURE YOU:   Understand these instructions.  Will watch your condition.  Will get help right away if you are not doing well or get worse. Document Released:  03/10/2004 Document Revised: 04/25/2011 Document Reviewed: 06/05/2009 [facility] Patient Information 2015 [employer], [place]. This information is not intended to replace advice given to you by your health care provider. Make sure you discuss any questions you have with your health care provider.

## 2014-04-14 ENCOUNTER — Ambulatory Visit: Payer: Self-pay | Admitting: Gastroenterology

## 2014-05-21 ENCOUNTER — Encounter (HOSPITAL_COMMUNITY): Payer: Self-pay | Admitting: *Deleted

## 2014-05-21 ENCOUNTER — Emergency Department (HOSPITAL_COMMUNITY)
Admission: EM | Admit: 2014-05-21 | Discharge: 2014-05-22 | Disposition: A | Discharge status: Home / Self Care | Payer: No Typology Code available for payment source | Attending: Emergency Medicine | Admitting: Emergency Medicine

## 2014-05-21 DIAGNOSIS — W1789XA Other fall from one level to another, initial encounter: Secondary | ICD-10-CM | POA: Diagnosis not present

## 2014-05-21 DIAGNOSIS — T148 Other injury of unspecified body region: Secondary | ICD-10-CM | POA: Insufficient documentation

## 2014-05-21 DIAGNOSIS — Z7951 Long term (current) use of inhaled steroids: Secondary | ICD-10-CM | POA: Insufficient documentation

## 2014-05-21 DIAGNOSIS — S199XXA Unspecified injury of neck, initial encounter: Secondary | ICD-10-CM | POA: Insufficient documentation

## 2014-05-21 DIAGNOSIS — S299XXA Unspecified injury of thorax, initial encounter: Secondary | ICD-10-CM | POA: Insufficient documentation

## 2014-05-21 DIAGNOSIS — S3992XA Unspecified injury of lower back, initial encounter: Secondary | ICD-10-CM | POA: Diagnosis not present

## 2014-05-21 DIAGNOSIS — Z79899 Other long term (current) drug therapy: Secondary | ICD-10-CM | POA: Diagnosis not present

## 2014-05-21 DIAGNOSIS — Y9389 Activity, other specified: Secondary | ICD-10-CM | POA: Insufficient documentation

## 2014-05-21 DIAGNOSIS — G43909 Migraine, unspecified, not intractable, without status migrainosus: Secondary | ICD-10-CM | POA: Diagnosis not present

## 2014-05-21 DIAGNOSIS — Y92410 Unspecified street and highway as the place of occurrence of the external cause: Secondary | ICD-10-CM | POA: Diagnosis not present

## 2014-05-21 DIAGNOSIS — R55 Syncope and collapse: Secondary | ICD-10-CM | POA: Diagnosis not present

## 2014-05-21 DIAGNOSIS — T148XXA Other injury of unspecified body region, initial encounter: Secondary | ICD-10-CM

## 2014-05-21 DIAGNOSIS — Z792 Long term (current) use of antibiotics: Secondary | ICD-10-CM | POA: Diagnosis not present

## 2014-05-21 DIAGNOSIS — S0990XA Unspecified injury of head, initial encounter: Secondary | ICD-10-CM | POA: Diagnosis not present

## 2014-05-21 DIAGNOSIS — Y998 Other external cause status: Secondary | ICD-10-CM | POA: Diagnosis not present

## 2014-05-21 DIAGNOSIS — R05 Cough: Secondary | ICD-10-CM | POA: Insufficient documentation

## 2014-05-21 MED ORDER — METHOCARBAMOL 500 MG PO TABS
500.0000 mg | ORAL_TABLET | Freq: Once | ORAL | Status: AC
  Administered 2014-05-21: 500 mg via ORAL
  Filled 2014-05-21: qty 1

## 2014-05-21 MED ORDER — KETOROLAC TROMETHAMINE 60 MG/2ML IM SOLN
60.0000 mg | Freq: Once | INTRAMUSCULAR | Status: AC
  Administered 2014-05-21: 60 mg via INTRAMUSCULAR
  Filled 2014-05-21: qty 2
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
## 2014-05-22 DIAGNOSIS — S0990XA Unspecified injury of head, initial encounter: Secondary | ICD-10-CM | POA: Diagnosis not present

## 2014-05-22 MED ORDER — METHOCARBAMOL 500 MG PO TABS: 500.0000 mg | ORAL_TABLET | Freq: Four times a day (QID) | ORAL | Status: DC

## 2014-05-22 MED ORDER — NAPROXEN 500 MG PO TABS: 500.0000 mg | ORAL_TABLET | Freq: Two times a day (BID) | ORAL | Status: DC

## 2014-05-22 MED ORDER — BENZONATATE 100 MG PO CAPS: 100.0000 mg | ORAL_CAPSULE | Freq: Three times a day (TID) | ORAL | Status: DC

## 2014-05-22 MED ORDER — BENZONATATE 100 MG PO CAPS
100.0000 mg | ORAL_CAPSULE | Freq: Once | ORAL | Status: AC
  Administered 2014-05-22: 100 mg via ORAL
  Filled 2014-05-22: qty 1

## 2014-05-22 NOTE — Discharge Instructions (Signed)
Please read and follow all provided instructions.  Your diagnoses today include:  1. Muscle strain   2. Minor head injury, initial encounter     Tests performed today include:  Vital signs. See below for your results today.   Medications prescribed:   Robaxin (methocarbamol) - muscle relaxer medication  DO NOT drive or perform any activities that require you to be awake and alert because this medicine can make you drowsy.    Naproxen - anti-inflammatory pain medication  Do not exceed 500mg  naproxen every 12 hours, take with food  You have been prescribed an anti-inflammatory medication or NSAID. Take with food. Take smallest effective dose for the shortest duration needed for your pain. Stop taking if you experience stomach pain or vomiting.   Take any prescribed medications only as directed.  Home care instructions:  Follow any educational materials contained in this packet.  Follow-up instructions: Please follow-up with your primary care provider in the next 3 days for further evaluation of your symptoms.   Return instructions:  SEEK IMMEDIATE MEDICAL ATTENTION IF:  There is confusion or drowsiness (although children frequently become drowsy after injury).   You cannot awaken the injured person.   You have more than one episode of vomiting.   You notice dizziness or unsteadiness which is getting worse, or inability to walk.   You have convulsions or unconsciousness.   You experience severe, persistent headaches not relieved by Tylenol.  You cannot use arms or legs normally.   There are changes in pupil sizes. (This is the black center in the colored part of the eye)   There is clear or bloody discharge from the nose or ears.   You have change in speech, vision, swallowing, or understanding.   Localized weakness, numbness, tingling, or change in bowel or bladder control.  You have any other emergent concerns.  Additional Information: You have had a head  injury which does not appear to require admission at this time.  Your vital signs today were: BP 113/73 mmHg   Pulse 72   Temp(Src) 98.3 F (36.8 C) (Oral)   Resp 18   SpO2 97% If your blood pressure (BP) was elevated above 135/85 this visit, please have this repeated by your doctor within one month. --------------

## 2014-05-27 ENCOUNTER — Emergency Department (HOSPITAL_COMMUNITY): Payer: No Typology Code available for payment source

## 2014-05-27 ENCOUNTER — Encounter (HOSPITAL_COMMUNITY): Payer: Self-pay

## 2014-05-27 ENCOUNTER — Emergency Department (HOSPITAL_COMMUNITY)
Admission: EM | Admit: 2014-05-27 | Discharge: 2014-05-27 | Disposition: A | Discharge status: Home / Self Care | Payer: No Typology Code available for payment source | Attending: Emergency Medicine | Admitting: Emergency Medicine

## 2014-05-27 DIAGNOSIS — G43909 Migraine, unspecified, not intractable, without status migrainosus: Secondary | ICD-10-CM | POA: Insufficient documentation

## 2014-05-27 DIAGNOSIS — S0990XA Unspecified injury of head, initial encounter: Secondary | ICD-10-CM | POA: Diagnosis not present

## 2014-05-27 DIAGNOSIS — Y998 Other external cause status: Secondary | ICD-10-CM | POA: Insufficient documentation

## 2014-05-27 DIAGNOSIS — Y9389 Activity, other specified: Secondary | ICD-10-CM | POA: Insufficient documentation

## 2014-05-27 DIAGNOSIS — Z7951 Long term (current) use of inhaled steroids: Secondary | ICD-10-CM | POA: Insufficient documentation

## 2014-05-27 DIAGNOSIS — S3991XA Unspecified injury of abdomen, initial encounter: Secondary | ICD-10-CM | POA: Insufficient documentation

## 2014-05-27 DIAGNOSIS — Z791 Long term (current) use of non-steroidal anti-inflammatories (NSAID): Secondary | ICD-10-CM | POA: Diagnosis not present

## 2014-05-27 DIAGNOSIS — Z792 Long term (current) use of antibiotics: Secondary | ICD-10-CM | POA: Diagnosis not present

## 2014-05-27 DIAGNOSIS — Z79899 Other long term (current) drug therapy: Secondary | ICD-10-CM | POA: Diagnosis not present

## 2014-05-27 DIAGNOSIS — Y92410 Unspecified street and highway as the place of occurrence of the external cause: Secondary | ICD-10-CM | POA: Diagnosis not present

## 2014-05-27 LAB — LIPASE, BLOOD: Lipase: 26 U/L (ref 11–59)

## 2014-05-27 LAB — COMPREHENSIVE METABOLIC PANEL
ALK PHOS: 78 U/L (ref 39–117)
ALT: 32 U/L (ref 0–35)
AST: 33 U/L (ref 0–37)
Albumin: 4 g/dL (ref 3.5–5.2)
Anion gap: 7 (ref 5–15)
BILIRUBIN TOTAL: 0.6 mg/dL (ref 0.3–1.2)
BUN: 13 mg/dL (ref 6–23)
CO2: 27 mmol/L (ref 19–32)
Calcium: 9.1 mg/dL (ref 8.4–10.5)
Chloride: 105 mmol/L (ref 96–112)
Creatinine, Ser: 0.91 mg/dL (ref 0.50–1.10)
GFR calc Af Amer: 87 mL/min — ABNORMAL LOW (ref >90)
GFR calc non Af Amer: 75 mL/min — ABNORMAL LOW (ref >90)
Glucose, Bld: 96 mg/dL (ref 70–99)
Potassium: 4.1 mmol/L (ref 3.5–5.1)
Sodium: 139 mmol/L (ref 135–145)
Total Protein: 7.3 g/dL (ref 6.0–8.3)

## 2014-05-27 LAB — URINALYSIS, ROUTINE W REFLEX MICROSCOPIC
BILIRUBIN URINE: NEGATIVE
GLUCOSE, UA: NEGATIVE mg/dL
Hgb urine dipstick: NEGATIVE
Ketones, ur: 15 mg/dL — AB
LEUKOCYTES UA: NEGATIVE
Nitrite: NEGATIVE
PH: 6 (ref 5.0–8.0)
Protein, ur: NEGATIVE mg/dL
Specific Gravity, Urine: >1.03 — ABNORMAL HIGH (ref 1.005–1.030)
Urobilinogen, UA: 1 mg/dL (ref 0.0–1.0)

## 2014-05-27 LAB — CBC WITH DIFFERENTIAL/PLATELET
BASOS ABS: 0.2 10*3/uL — AB (ref 0.0–0.1)
Basophils Relative: 2 % — ABNORMAL HIGH (ref 0–1)
Eosinophils Absolute: 0.2 10*3/uL (ref 0.0–0.7)
Eosinophils Relative: 2 % (ref 0–5)
HEMATOCRIT: 38.9 % (ref 36.0–46.0)
Hemoglobin: 13 g/dL (ref 12.0–15.0)
LYMPHS ABS: 4.2 10*3/uL — AB (ref 0.7–4.0)
LYMPHS PCT: 49 % — AB (ref 12–46)
MCH: 30.8 pg (ref 26.0–34.0)
MCHC: 33.4 g/dL (ref 30.0–36.0)
MCV: 92.2 fL (ref 78.0–100.0)
Monocytes Absolute: 0.6 10*3/uL (ref 0.1–1.0)
Monocytes Relative: 7 % (ref 3–12)
Neutro Abs: 3.4 10*3/uL (ref 1.7–7.7)
Neutrophils Relative %: 40 % — ABNORMAL LOW (ref 43–77)
PLATELETS: 277 10*3/uL (ref 150–400)
RBC: 4.22 MIL/uL (ref 3.87–5.11)
RDW: 14.4 % (ref 11.5–15.5)
WBC: 8.5 10*3/uL (ref 4.0–10.5)

## 2014-05-27 LAB — POC URINE PREG, ED: Preg Test, Ur: NEGATIVE

## 2014-05-27 MED ORDER — OXYCODONE-ACETAMINOPHEN 5-325 MG PO TABS: 2.0000 | ORAL_TABLET | ORAL | Status: DC | PRN

## 2014-05-27 MED ORDER — FENTANYL CITRATE 0.05 MG/ML IJ SOLN
50.0000 ug | Freq: Once | INTRAMUSCULAR | Status: AC
  Administered 2014-05-27: 50 ug via INTRAVENOUS
  Filled 2014-05-27: qty 2

## 2014-05-27 MED ORDER — ONDANSETRON 4 MG PO TBDP
4.0000 mg | ORAL_TABLET | Freq: Once | ORAL | Status: AC
  Administered 2014-05-27: 4 mg via ORAL
  Filled 2014-05-27: qty 1

## 2014-05-27 NOTE — ED Notes (Signed)
Pt ambulated to the bathroom independently, pt tolerated well.

## 2014-05-27 NOTE — Discharge Instructions (Signed)
Head Injury Take pain medication as prescribed.  I have given you [facility] and wellness for follow up as well as a resource guide.  You have received a head injury. It does not appear serious at this time. Headaches and vomiting are common following head injury. It should be easy to awaken from sleeping. Sometimes it is necessary for you to stay in the emergency department for a while for observation. Sometimes admission to the hospital may be needed. After injuries such as yours, most problems occur within the first 24 hours, but side effects may occur up to 7-10 days after the injury. It is important for you to carefully monitor your condition and contact your health care provider or seek immediate medical care if there is a change in your condition. WHAT ARE THE TYPES OF HEAD INJURIES? Head injuries can be as minor as a bump. Some head injuries can be more severe. More severe head injuries include:  A jarring injury to the brain (concussion).  A bruise of the brain (contusion). This mean there is bleeding in the brain that can cause swelling.  A cracked skull (skull fracture).  Bleeding in the brain that collects, clots, and forms a bump (hematoma). WHAT CAUSES A HEAD INJURY? A serious head injury is most likely to happen to someone who is in a car wreck and is not wearing a seat belt. Other causes of major head injuries include bicycle or motorcycle accidents, sports injuries, and falls. HOW ARE HEAD INJURIES DIAGNOSED? A complete history of the event leading to the injury and your current symptoms will be helpful in diagnosing head injuries. Many times, pictures of the brain, such as CT or MRI are needed to see the extent of the injury. Often, an overnight hospital stay is necessary for observation.  WHEN SHOULD I SEEK IMMEDIATE MEDICAL CARE?  You should get help right away if:  You have confusion or drowsiness.  You feel sick to your stomach (nauseous) or have continued, forceful  vomiting.  You have dizziness or unsteadiness that is getting worse.  You have severe, continued headaches not relieved by medicine. Only take over-the-counter or prescription medicines for pain, fever, or discomfort as directed by your health care provider.  You do not have normal function of the arms or legs or are unable to walk.  You notice changes in the black spots in the center of the colored part of your eye (pupil).  You have a clear or bloody fluid coming from your nose or ears.  You have a loss of vision. During the next 24 hours after the injury, you must stay with someone who can watch you for the warning signs. This person should contact local emergency services (911 in the U.S.) if you have seizures, you become unconscious, or you are unable to wake up. HOW CAN I PREVENT A HEAD INJURY IN THE FUTURE? The most important factor for preventing major head injuries is avoiding motor vehicle accidents. To minimize the potential for damage to your head, it is crucial to wear seat belts while riding in motor vehicles. Wearing helmets while bike riding and playing collision sports (like football) is also helpful. Also, avoiding dangerous activities around the house will further help reduce your risk of head injury.  WHEN CAN I RETURN TO NORMAL ACTIVITIES AND ATHLETICS? You should be reevaluated by your health care provider before returning to these activities. If you have any of the following symptoms, you should not return to activities or contact  sports until 1 week after the symptoms have stopped:  Persistent headache.  Dizziness or vertigo.  Poor attention and concentration.  Confusion.  Memory problems.  Nausea or vomiting.  Fatigue or tire easily.  Irritability.  Intolerant of bright lights or loud noises.  Anxiety or depression.  Disturbed sleep. MAKE SURE YOU:   Understand these instructions.  Will watch your condition.  Will get help right away if you are  not doing well or get worse. Document Released: 01/31/2005 Document Revised: 02/05/2013 Document Reviewed: 10/08/2012 [facility] Patient Information 2015 [place] [place]. This information is not intended to replace advice given to you by your health care provider. Make sure you discuss any questions you have with your health care provider.  Emergency Department Resource Guide 1) Find a Doctor and Pay Out of Pocket Although you won't have to find out who is covered by your insurance plan, it is a good idea to ask around and get recommendations. You will then need to call the office and see if the doctor you have chosen will accept you as a new patient and what types of options they offer for patients who are self-pay. Some doctors offer discounts or will set up payment plans for their patients who do not have insurance, but you will need to ask so you aren't surprised when you get to your appointment.  2) Contact Your Local Health Department Not all health departments have doctors that can see patients for sick visits, but many do, so it is worth a call to see if yours does. If you don't know where your local health department is, you can check in your phone book. The CDC also has a tool to help you locate your state's health department, and many state websites also have listings of all of their local health departments.  3) Find a Walk-in Clinic If your illness is not likely to be very severe or complicated, you may want to try a walk in clinic. These are popping up all over the country in pharmacies, drugstores, and shopping centers. They're usually staffed by nurse practitioners or physician assistants that have been trained to treat common illnesses and complaints. They're usually fairly quick and inexpensive. However, if you have serious medical issues or chronic medical problems, these are probably not your best option.  No Primary Care Doctor: - Call Health Connect at  [phone] - they can help you  locate a primary care doctor that  accepts your insurance, provides certain services, etc. - Physician Referral Service- [phone]  Chronic Pain Problems: Organization         Address  Phone   Notes  [facility]  [phone] Patients need to be referred by their primary care doctor.   Medication Assistance: Organization         Address  Phone   Notes  [facility] [place] Medication [facility] [address] [place], [place] [address] [phone] --Must be a resident of [place] -- Must have NO insurance coverage whatsoever (no Medicaid/ Medicare, etc.) -- The pt. MUST have a primary care doctor that directs their care regularly and follows them in the community   MedAssist  [phone]) [phone]   [employer]  [phone]) [phone]    Agencies that provide inexpensive medical care: Organization         Address  Phone   Notes  [facility]  [phone]   [facility]    [phone])  [phone]   [facility] [address] [place], [place] [address] [phone]   [facility] [address], [place] [phone]) [phone]   [facility]anned Parenthood    [phone]) [phone]   [facility]    [phone]   Community Health and [facility]  [address], [place] Phone:  [phone]) [phone], Fax:  [phone] Hours of Operation:  9 am - 6 pm, M-F.  Also accepts Medicaid/Medicare and self-pay.  [facility]  [address], [place] Phone: [phone], Fax: [phone]. Hours of Operation:  8:30 am - 5:30 pm, M-F.  Also accepts Medicaid and self-pay.  [facility] [place] [address], [place] Phone: [phone]) [phone]   [facility] [address], [place], [place] [phone], Ext. [phone] Mondays & Thursdays: 7-9 AM.  First 15 patients are seen on a first come, first serve basis.    Medicaid-accepting [place] [facility]  Providers:  Organization         Address  Phone   Notes  [facility] [address], [place] [phone] Also accepts self-pay patients.  [facility] [address] [place], [place]  [phone]) [phone]   [facility] [address], [place] [phone]) [phone]   [facility] [address] [place] [address], [place] [phone]) [phone]   Renaye RakersVeita Bland [address], [place]   [phone]) [phone] Only accepts [place] Access [place] patients after they have their name applied to their card.   Self-Pay (no insurance) in [facility]:  Organization         Address  Phone   Notes  Sickle Cell Patients, [facility] [address], [place] [phone]   [facility] [address], [place] [phone]) [phone]   [facility] [place]  [address], [place] [phone]) [phone]   [facility]/Dr. Osei-Bonsu  [address] [place] [address], [place] or [phone], [place] [phone] Phone number for both [place] and [place] locations is the same.  [facility] [address], [place] [phone]   [facility] [address] [place] [address], [place] or [address] [phone]) [phone]   [facility] [address], [place] [phone], phone; [phone]) [phone], fax Sees patients 1st and 3rd Saturday of every month.  Must not qualify for public or private insurance (i.e. Medicaid, Medicare, [place] Health Choice, Veterans' Benefits)  Household income should be no more than 200% of the poverty level The clinic cannot treat you if you are pregnant or think you are pregnant  Sexually transmitted diseases are not treated at the clinic.    Dental Care: Organization         Address  Phone  Notes  [facility] [place] Department of [facility] [address], [place] [phone] Accepts children up to age 46 who are enrolled in [place] or [place] Health Choice; pregnant women with a Medicaid card; and children who have applied for Medicaid or [place] Health Choice, but were declined, whose parents can pay a reduced fee at time of service.  [facility] Department of [facility] [place]  [address], [place] [phone] Accepts children up to  age 60 who are enrolled in Medicaid or [place] Health Choice; pregnant women with a Medicaid card; and children who have applied for Medicaid or  Health Choice, but were declined, whose parents can pay a reduced fee at time of service.  [facility]  [address] [place], [place] [phone] Patients are seen by appointment only. Walk-ins are not accepted. [facility] will see patients 59 years of age and older. Monday - Tuesday (8am-5pm) Most Wednesdays (8:30-5pm) $30 per visit, cash only  [facility] Adult Dental Access PROGRAM  [address], [facility] [phone] Patients are seen by appointment only. Walk-ins are not accepted. [facility] will see patients 52 years of age and older. One Wednesday Evening (Monthly: Volunteer Based).  $30 per visit, cash only  [employer]  [phone] for adults; Children under age 23, call Graduate Pediatric Dentistry at [phone]. Children aged 28-14, please call [phone] to request a pediatric application.  Dental services are provided in all areas of dental care including fillings, crowns and bridges, complete and partial dentures, implants, gum treatment, root canals, and extractions. Preventive care is also provided. Treatment is provided to both adults and children. Patients are selected via a lottery and there is often a waiting list.   [facility] [address], [place]  [phone] [email or web address]   [facility]  [address] [place], [place] [phone], Ext. [phone] Second and Fourth Thursday of each month, opens at 6:30 AM; Clinic ends at 9 AM.  Patients are seen on a first-come first-served basis, and a limited number are seen during each clinic.   [facility]  [address] [place], [place] [phone]   Eligibility Requirements You must have lived in [place], [place], or [place] counties for at least the last three months.   You cannot be eligible for state or federal sponsored [place], including [employer], [place], or [employer].   You generally cannot be eligible for healthcare insurance through your employer.    How to apply: Eligibility screenings are held every Tuesday and Wednesday afternoon from 1:00 pm until 4:00 pm. You do not need an appointment for the interview!  [facility] [address] [place], [place] [phone]   [facility] Health Department  [phone]   [facility] Health Department  [phone]   [facility] Health Department  [phone]    Behavioral Health Resources in the Community: Intensive Outpatient Programs Organization         Address  Phone  Notes  [facility] [address], [place], [place] [phone]   [facility] [address], [place], [place] [phone]   [facility] [address] [place], [place]  [phone]   [facility] [address],  [place], [place] [phone] or [phone]   Substance Abuse Resources Organization         Address  Phone  Notes  Alcohol and Drug Services  [phone]   [facility]  [phone]   [facility]  [phone]   Floydene Flock  [phone]   Residential & Outpatient Substance Abuse Program  [phone]   Psychological Services Organization         Address  Phone  Notes  [facility] Behavioral Health  [phone]     [facility]  [phone]   [facility] Mental Health  [address] [place] [phone] or [phone]    Mobile Crisis Teams Organization         Address  Phone  Notes  [facility], Mobile Crisis Care Unit  [phone]   [facility]  [address] [place], [place] [phone]   Doristine Locks [address] [place] [phone]    Self-Help/Support Groups Organization         Address  Phone             Notes  [facility] [place] - variety of support groups  336- I7437963 Call for more information  Narcotics Anonymous (NA), [facility] [address], [employer] [place]  2 meetings at this location   Statistician         Address  Phone  Notes  [facility] [address],    [place]  [phone]   [facility]  [address], [place] [address], [place], [place] [phone]   [facility] [address] [place], [place] [phone] Admissions: 8am-3pm M-F  [facility] [address]    [place], [place] [phone]   [facility] [address] [place], [place], [place] [phone]   [facility] [address]  [place], [place] [phone]   [facility] - Intensive Outpatient [address], [place], [place] [phone]   [facility] ([facility]iction [facility] [address]  [place], [place] [phone] or [phone]   [facility] ([facility]) [address], [place], [place] [phone] Accepts Medicaid  [facility] [address]  [place] [phone] Substance Abuse/Addiction Treatment   [facility] Organization         Address  Phone  Notes  [facility]  [phone]   Angie Fava, PhD [address] [place], [place]   [phone] or [phone]    [facility] Behavioral   [address] [place], [place] [phone]   [facility] [address], [place], [place] [phone] Insurance/Medicaid/sponsorship through [facility] [address]                                    [place], [place] [phone] Therapy/tele-psych/case  [facility] [address], [place] [phone]    Dr. Lolly Mustache  [phone]   [facility] [place]  [employer] [facility] Dept. 1) [address], [place] 2) [address], [place] 3)  [address], [place] [phone]  [phone]   [facility] Child Abuse Hotline (562) 237-6387 or 7025791103 (After Hours)

## 2014-05-27 NOTE — ED Notes (Signed)
Pt was seen Wednesday for other reason but back because of headache and blurry vision that has gotten worse. Still having abd spasms and yesterday had some rectal bleeding. Has not had a BM in a week.
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
## 2014-05-27 NOTE — ED Notes (Signed)
Pt stable, ambulatory, pain decreased to 6/10, states understanding of discharge instructions 

## 2014-06-09 LAB — SURGICAL PATHOLOGY

## 2014-10-29 ENCOUNTER — Ambulatory Visit: Payer: No Typology Code available for payment source | Admitting: Physical Therapy

## 2014-10-30 ENCOUNTER — Ambulatory Visit: Payer: No Typology Code available for payment source | Attending: Gastroenterology | Admitting: Physical Therapy

## 2014-10-30 ENCOUNTER — Encounter: Payer: Self-pay | Admitting: Physical Therapy

## 2014-10-30 DIAGNOSIS — N8184 Pelvic muscle wasting: Secondary | ICD-10-CM | POA: Insufficient documentation

## 2014-10-30 DIAGNOSIS — M6289 Other specified disorders of muscle: Secondary | ICD-10-CM

## 2014-10-30 NOTE — Patient Instructions (Addendum)
About Abdominal Massage  Abdominal massage, also called external colon massage, is a self-treatment circular massage technique that can reduce and eliminate gas and ease constipation. The colon naturally contracts in waves in a clockwise direction starting from inside the right hip, moving up toward the ribs, across the belly, and down inside the left hip.  When you perform circular abdominal massage, you help stimulate your colon's normal wave pattern of movement called peristalsis.  It is most beneficial when done after eating.  Positioning You can practice abdominal massage with oil while lying down, or in the shower with soap.  Some people find that it is just as effective to do the massage through clothing while sitting or standing.  How to Massage Start by placing your finger tips or knuckles on your right side, just inside your hip bone.  . Make small circular movements while you move upward toward your rib cage.   . Once you reach the bottom right side of your rib cage, take your circular movements across to the left side of the bottom of your rib cage.  . Next, move downward until you reach the inside of your left hip bone.  This is the path your feces travel in your colon. . Continue to perform your abdominal massage in this pattern for 10 minutes each day.     You can apply as much pressure as is comfortable in your massage.  Start gently and build pressure as you continue to practice.  Notice any areas of pain as you massage; areas of slight pain may be relieved as you massage, but if you have areas of significant or intense pain, consult with your healthcare provider.  Other Considerations . General physical activity including bending and stretching can have a beneficial massage-like effect on the colon.  Deep breathing can also stimulate the colon because breathing deeply activates the same nervous system that supplies the colon.   . Abdominal massage should always be used in  combination with a bowel-conscious diet that is high in the proper type of fiber for you, fluids (primarily water), and a regular exercise program. [facility] [address], [address] [place], [place] [address] Phone # [phone] Fax [phone]  

## 2014-10-30 NOTE — Therapy (Signed)
[facility] [address], [address] [place], [place], [address] Phone: [phone]   Fax:  [phone]  Physical Therapy Evaluation  Patient Details  Name: Mary [name] MRN: [id number] Date of Birth: [birth year] Referring Provider:  Brown Human, MD  Encounter Date: 10/30/2014      PT End of Session - 10/30/14 1442    Visit Number 1   Date for PT Re-Evaluation 01/22/15   PT Start Time 1445   PT Stop Time 1525   PT Time Calculation (min) 40 min   Activity Tolerance Patient limited by pain   Behavior During Therapy [facility] for tasks assessed/performed      Past Medical History  Diagnosis Date  . Migraines     Past Surgical History  Procedure Laterality Date  . Abdominal hysterectomy    . Hernia repair    . Colonoscopy N/A 07/11/2012    Procedure: COLONOSCOPY;  Surgeon: Barrie Folk, MD;  Location: [facility];  Service: Endoscopy;  Laterality: N/A;    There were no vitals filed for this visit.  Visit Diagnosis:  Pelvic floor dysfunction - Plan: PT plan of care cert/re-cert      Subjective Assessment - 10/30/14 1453    Subjective Patient reports 4 years ago she was having problematic constipation.  Patient reports more pain with bowel movement, rectal bleeding, and hemorrohoids.  Patient is scared to eat due to the constipation.    How long can you sit comfortably? has to shift boody weight.    Patient Stated Goals work on the constipation, reduce the discomfort   Currently in Pain? 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  No   Is the patient reluctant to leave their home because of a fear of falling?  No   Prior Function   Level of Independence Independent   Vocation Full time employment   Vocation Requirements sitting   Cognition   Overall Cognitive Status Within Functional Limits for tasks assessed   Observation/Other Assessments   Focus on Therapeutic Outcomes (FOTO)  57% limitation for Bowel Cnst survey   ROM / Strength   AROM / PROM / Strength AROM;Strength   AROM   Overall AROM Comments Lumbar ROM is full   Strength   Right Hip Flexion 4/5   Left Hip Flexion 4/5   Palpation   SI assessment  right ilium is anteriorly rotated   Palpation comment palpable tenderness located in abdomen on the lower area,                  Pelvic Floor Special Questions - 10/30/14 0001    Prior Pregnancies  Yes   Number of Pregnancies 4   Number of C-Sections 3  c-section scar for tubal pregnancy   Number of Vaginal Deliveries 1   Currently Sexually Active No   Marinoff Scale discomfort that does not affect completion   Urinary Leakage No   Urinary urgency Yes  urinates every 30 min.    Skin Integrity Hemorroids;Other  boils on the left labia minora and bil. inner thigh   Pelvic Floor Internal Exam Patient confirms identification and approves physical therapist to assess pelvic floor strength and muscle integrity   Exam Type Vaginal;Rectal   Palpation Rectally pelvic floor contraction had no lift and palpable tenderness located on internal sphinter, puborectalis, ilicoccygeus.  Vaginally tenderness located on bil. puborectalis, bil. obturator internist, bil. iliococcygeus   Strength weak squeeze, no lift   vaginally is 5/5   Strength # of seconds 2  vaginally 2 seconds                   PT Education - 10/30/14 1624    Education provided Yes   Education Details abdominal massage   Person(s) Educated Patient   Methods Explanation;Demonstration;Verbal cues;Handout;Tactile cues   Comprehension Returned demonstration;Verbalized understanding          PT Short Term Goals - 10/30/14 1643    PT SHORT TERM GOAL #1   Title understand how to perform abdominal massage to improve constipation   Time 4   Period Weeks   Status New   PT SHORT TERM GOAL #2   Title understand correct toileting technique to have a bowel movement   Time 4   Period Weeks   Status New   PT SHORT TERM GOAL #3   Title understand how to perform perineal massage to relax pelvic floor   Time 4   Period Weeks   Status New   PT SHORT TERM GOAL #4   Title understand how to delay urge to void 30 extra minutes   Time 4   Period Weeks   Status New           PT Long Term Goals - 10/30/14 1644    PT LONG TERM GOAL #1   Title independent with HEP and understand how to progress herself   Time 12   Period Weeks   Status New   PT LONG TERM GOAL #2   Title ability to have a bowel movement every 2-3 days due to ability to relax pelvic floor to 3-4 uv when bearing down   Time 12   Period Weeks   Status New   PT LONG TERM GOAL #3   Title lower abdominal pain decreased >/= 75% due to improved bowel habits   Time 12   Period Weeks   Status New   PT LONG TERM GOAL #4   Title ability to urinate every 2 hours due to improved pelvic floor control   Time 12   Period Weeks   Status New               Plan - 10/30/14 1625    Clinical Impression Statement Patient is a 44 year old female with diagnosis of constipatient due to outlet dysfunction and pelvic floor dysfunction.  Patient has had consitpation problems since the last 4 years when she started to have pain, rectal bleeding and hemrroids.   Patients anorectal manometry at doctors office showed weak internal and external anal sphincter, unable to expel a defecation balloon, strain manuever revealed an increase with  pelvic floor activity with strain, and low maximal squeeze activity. Bil. hip flexion strength is 4/5.  Anal sphinter strength is 2/5 with no lift.  Vaginal strength is 5/5 but only able to hold contraction 2 seconds.  Palpable tenderness located on bil. obturator internist, puborectalis, iliococcygeus, and internal sphinter.  Left side of urethra was tighter.   Patient reports her stomach is very bloated due to the infrequent bowel movements.  Patient will have blood with bowel movement due to hemorroids. Patient pain level is 6/10 in lower abdominal when she has not had a bowel movement.  Patient reports she has to urinate every 30 to 60 minutes and when she has the urge to urinate she has to rush to the bathroom.  Patient reports some discomfort with intercourse.   Patient would benefit from physical therapy to reduce pain, improve abiity to have a bowel movement, improve relaxation of pelvic floor when having a bowel movement.    Pt will benefit from skilled therapeutic intervention in order to improve on the following deficits Decreased coordination;Increased fascial restricitons;Increased muscle spasms;Decreased endurance;Decreased activity tolerance;Pain;Impaired flexibility;Decreased mobility;Decreased strength   Rehab Potential Good   Clinical Impairments Affecting Rehab Potential None   PT Frequency 1x / week   PT Duration 12 weeks   PT Treatment/Interventions Biofeedback;ADLs/Self Care Home Management;Electrical Stimulation;Moist Heat;Therapeutic exercise;Therapeutic activities;Ultrasound;Neuromuscular re-education;Patient/family education;Manual techniques   PT Next Visit Plan toileting technique, abdominal massage, pelvic floor EMG, relaxation technique, soft tissue work to pelvic floor   PT Home Exercise Plan toileting  technique   Recommended Other Services None   Consulted and Agree with Plan of Care Patient         Problem List Patient Active Problem List   Diagnosis Date Noted  . Abdominal pain, acute 05/23/2012  . Rectal bleeding 05/23/2012  . Nausea and vomiting 05/23/2012  . Dehydration 05/23/2012    GRAY,CHERYL,PT 10/30/2014, 4:49 PM  [facility] [address] [place], [place], [address] Phone: [phone]   Fax:  [phone]

## 2014-11-10 ENCOUNTER — Encounter: Payer: No Typology Code available for payment source | Admitting: Physical Therapy

## 2014-11-10 ENCOUNTER — Ambulatory Visit: Payer: No Typology Code available for payment source | Admitting: Physical Therapy

## 2014-11-17 ENCOUNTER — Ambulatory Visit: Payer: No Typology Code available for payment source | Attending: Gastroenterology | Admitting: Physical Therapy

## 2014-11-17 ENCOUNTER — Encounter: Payer: Self-pay | Admitting: Physical Therapy

## 2014-11-17 DIAGNOSIS — N8184 Pelvic muscle wasting: Secondary | ICD-10-CM | POA: Insufficient documentation

## 2014-11-17 DIAGNOSIS — M6289 Other specified disorders of muscle: Secondary | ICD-10-CM

## 2014-11-17 NOTE — Patient Instructions (Signed)
Use app for relaxation breathing.  Perform 5 minutes per day, another time while you are having a bowel movement or when you are feeling spasms.  App I have is called breathe Right.  Toileting Techniques for Bowel Movements (Defecation) Using your belly (abdomen) and pelvic floor muscles to have a bowel movement is usually instinctive.  Sometimes people can have problems with these muscles and have to relearn proper defecation (emptying) techniques.  If you have weakness in your muscles, organs that are falling out, decreased sensation in your pelvis, or ignore your urge to go, you may find yourself straining to have a bowel movement.  You are straining if you are: . holding your breath or taking in a huge gulp of air and holding it  . keeping your lips and jaw tensed and closed tightly . turning red in the face because of excessive pushing or forcing . developing or worsening your  hemorrhoids . getting faint while pushing . not emptying completely and have to defecate many times a day  If you are straining, you are actually making it harder for yourself to have a bowel movement.  Many people find they are pulling up with the pelvic floor muscles and closing off instead of opening the anus. Due to lack pelvic floor relaxation and coordination the abdominal muscles, one has to work harder to push the feces out.  Many people have never been taught how to defecate efficiently and effectively.  Notice what happens to your body when you are having a bowel movement.  While you are sitting on the toilet pay attention to the following areas: . Jaw and mouth position . Angle of your hips   . Whether your feet touch the ground or not . Arm placement  . Spine position . Waist . Belly tension . Anus (opening of the anal canal)  An Evacuation/Defecation Plan   Here are the 4 basic points:  1. Lean forward enough for your elbows to rest on your knees 2. Support your feet on the floor or use a low  stool if your feet don't touch the floor  3. Push out your belly as if you have swallowed a beach ball-you should feel a widening of your waist 4. Open and relax your pelvic floor muscles, rather than tightening around the anus      The following conditions my require modifications to your toileting posture:  . If you have had surgery in the past that limits your back, hip, pelvic, knee or ankle flexibility . Constipation   Your healthcare practitioner may make the following additional suggestions and adjustments:  1) Sit on the toilet  a) Make sure your feet are supported. b) Notice your hip angle and spine position-most people find it effective to lean forward or raise their knees, which can help the muscles around the anus to relax  c) When you lean forward, place your forearms on your thighs for support  2) Relax suggestions a) Breath deeply in through your nose and out slowly through your mouth as if you are smelling the flowers and blowing out the candles. b) To become aware of how to relax your muscles, contracting and releasing muscles can be helpful.  Pull your pelvic floor muscles in tightly by using the image of holding back gas, or closing around the anus (visualize making a circle smaller) and lifting the anus up and in.  Then release the muscles and your anus should drop down and feel open. Repeat 5  times ending with the feeling of relaxation. c) Keep your pelvic floor muscles relaxed; let your belly bulge out. d) The digestive tract starts at the mouth and ends at the anal opening, so be sure to relax both ends of the tube.  Place your tongue on the roof of your mouth with your teeth separated.  This helps relax your mouth and will help to relax the anus at the same time.  3) Empty (defecation) a) Keep your pelvic floor and sphincter relaxed, then bulge your anal muscles.  Make the anal opening wide.  b) Stick your belly out as if you have swallowed a beach ball. c) Make  your belly wall hard using your belly muscles while continuing to breathe. Doing this makes it easier to open your anus. d) Breath out and give a grunt (or try using other sounds such as ahhhh, shhhhh, ohhhh or grrrrrrr).  4) Finish a) As you finish your bowel movement, pull the pelvic floor muscles up and in.  This will leave your anus in the proper place rather than remaining pushed out and down. If you leave your anus pushed out and down, it will start to feel as though that is normal and give you incorrect signals about needing to have a bowel movement.    Mary [name], PT [facility] [address] [place], [place] [address]

## 2014-11-17 NOTE — Therapy (Signed)
[facility] [address], [place], [place], [address] Phone: [phone]   Fax:  [phone]  Physical Therapy Treatment  Patient Details  Name: Mary [name] MRN: [id number] Date of Birth: 03-22-1970 Referring Provider:  Kathreen Cosier, MD  Encounter Date: 11/17/2014      PT End of Session - 11/17/14 0801    Visit Number 2   Date for PT Re-Evaluation 01/22/15   PT Start Time 0800   PT Stop Time 0840   PT Time Calculation (min) 40 min   Activity Tolerance Patient tolerated treatment well   Behavior During Therapy [facility] for tasks assessed/performed      Past Medical History  Diagnosis Date  . Migraines     Past Surgical History  Procedure Laterality Date  . Abdominal hysterectomy    . Hernia repair    . Colonoscopy N/A 07/11/2012    Procedure: COLONOSCOPY;  Surgeon: Missy Sabins, MD;  Location: [facility];  Service: Endoscopy;  Laterality: N/A;    There were no vitals filed for this visit.  Visit Diagnosis:  Pelvic floor dysfunction      Subjective Assessment - 11/17/14 0759    Subjective Since last visit I went to the bathroom 2 times.  I bought the [employer].  I have been doing the abominal massage.    How long can you sit comfortably? has to shift boody weight.    Patient Stated Goals work on the constipation, reduce the discomfort   Currently in Pain? Yes   Pain Score 4    Pain Location Abdomen   Pain Orientation Right;Lower   Pain Descriptors / Indicators Spasm   Pain Type Chronic pain   Pain Onset More than a month ago   Pain Frequency Intermittent   Aggravating Factors  being constipated   Pain Relieving Factors stretch, walk it out   Multiple Pain Sites No                      Pelvic Floor Special Questions - 11/17/14 0001    Biofeedback resting tone 5.01 uv, 3 quick contractions 20.36 uv, 10 second contraction 30.88 uv, 20 second contraction 26.88 uv, resting  tone 2.99 uv.     Biofeedback sensor type Rectal   Biofeedback Activity --  assessment in sidely; relaxation breathing                   PT Education - 11/17/14 0842    Education provided Yes   Education Details toileting technique, relaxation exercise   Person(s) Educated Patient   Methods Explanation;Demonstration;Verbal cues;Handout;Tactile cues   Comprehension Verbalized understanding;Returned demonstration          PT Short Term Goals - 11/17/14 0930    PT SHORT TERM GOAL #1   Title understand how to perform abdominal massage to improve constipation   Time 4   Period Weeks   Status Achieved   PT SHORT TERM GOAL #2   Title understand correct toileting technique to have a bowel movement   Time 4   Period Weeks   Status On-going  just learned   PT SHORT TERM GOAL #3   Title understand how to perform perineal massage to relax pelvic floor   Time 4   Period Weeks   Status New   PT SHORT TERM GOAL #4   Title understand how to delay urge to void 30 extra minutes   Time 4   Period  Weeks   Status New           PT Long Term Goals - 10/30/14 1644    PT LONG TERM GOAL #1   Title independent with HEP and understand how to progress herself   Time 12   Period Weeks   Status New   PT LONG TERM GOAL #2   Title ability to have a bowel movement every 2-3 days due to ability to relax pelvic floor to 3-4 uv when bearing down   Time 12   Period Weeks   Status New   PT LONG TERM GOAL #3   Title lower abdominal pain decreased >/= 75% due to improved bowel habits   Time 12   Period Weeks   Status New   PT LONG TERM GOAL #4   Title ability to urinate every 2 hours due to improved pelvic floor control   Time 12   Period Weeks   Status New               Plan - 11/17/14 0842    Clinical Impression Statement Patient is a 44 year old female with diagnosis of constipation due to outlet dysfunction and pelvic floor dysfunction.  Pelvic floor EMG: resting  tone 5.01 uv, 3 quick 20..36, 10 sec 30.88uv, 20 second 26.88 uv, rest after exercise 2.99 uv.  Patient has difficulty relaxing after  contraction.  Patient has difficulty  pushing sensor out with breathing.  Patient has not met goals due to just having  1 treatment. Patient will benefit  fromphysical therapy to relax musles and improve coordination.    Pt will benefit from skilled therapeutic intervention in order to improve on the following deficits Decreased coordination;Increased fascial restricitons;Increased muscle spasms;Decreased endurance;Decreased activity tolerance;Pain;Impaired flexibility;Decreased mobility;Decreased strength   Rehab Potential Good   Clinical Impairments Affecting Rehab Potential None   PT Frequency 1x / week   PT Duration 12 weeks   PT Treatment/Interventions Biofeedback;ADLs/Self Care Home Management;Electrical Stimulation;Moist Heat;Therapeutic exercise;Therapeutic activities;Ultrasound;Neuromuscular re-education;Patient/family education;Manual techniques   PT Next Visit Plan soft tissue work; how to delay urge   PT Home Exercise Plan flexibility exercises   Consulted and Agree with Plan of Care Patient        Problem List Patient Active Problem List   Diagnosis Date Noted  . Abdominal pain, acute 05/23/2012  . Rectal bleeding 05/23/2012  . Nausea and vomiting 05/23/2012  . Dehydration 05/23/2012    GRAY,CHERYL,PT 11/17/2014, 9:32 AM  [facility] [address], [place], [place], [address] Phone: [phone]   Fax:  [phone]

## 2014-11-26 ENCOUNTER — Ambulatory Visit: Payer: No Typology Code available for payment source | Admitting: Physical Therapy

## 2014-11-26 ENCOUNTER — Encounter: Payer: Self-pay | Admitting: Physical Therapy

## 2014-11-26 DIAGNOSIS — M6289 Other specified disorders of muscle: Secondary | ICD-10-CM

## 2014-11-26 DIAGNOSIS — N8184 Pelvic muscle wasting: Secondary | ICD-10-CM | POA: Diagnosis not present

## 2014-11-26 NOTE — Patient Instructions (Addendum)
Relaxation Exercises with the Urge to Void   When you experience an urge to void:  FIRST  Stop and stand very still    Sit down if you can    Don't move    You need to stay very still to maintain control    Do your deep breathing  SECOND Squeeze your pelvic floor muscles ( anal)  5 times, like a quick flick, to keep from leaking  THIRD Relax  Take a deep breath and then let it out  Try to make the urge go away by using relaxation and visualization techniques  FINALLY When you feel the urge go away somewhat, walk normally to the bathroom.   If the urge gets suddenly stronger on the way, you may stop again and relax to regain control.  [facility] [address], [address] [place], [place] [address] Phone # [phone] Fax [phone]

## 2014-11-26 NOTE — Therapy (Signed)
[facility] [address] [place], [place], [address] Phone: [phone]   Fax:  [phone]  Physical Therapy Treatment  Patient Details  Name: Mary [name] MRN: [id number] Date of Birth: May 23, 1970 Referring Provider:  Kathreen Cosier, MD  Encounter Date: 11/26/2014      PT End of Session - 11/26/14 0849    Visit Number 3   Date for PT Re-Evaluation 01/22/15   PT Start Time 0845   PT Stop Time 0930   PT Time Calculation (min) 45 min   Activity Tolerance Patient tolerated treatment well   Behavior During Therapy [facility] for tasks assessed/performed      Past Medical History  Diagnosis Date  . Migraines     Past Surgical History  Procedure Laterality Date  . Abdominal hysterectomy    . Hernia repair    . Colonoscopy N/A 07/11/2012    Procedure: COLONOSCOPY;  Surgeon: Missy Sabins, MD;  Location: [facility];  Service: Endoscopy;  Laterality: N/A;    There were no vitals filed for this visit.  Visit Diagnosis:  Pelvic floor dysfunction      Subjective Assessment - 11/26/14 0850    Subjective I have had less spasms.  I have been doing the breathing exercises.  Pain has improved by 10%.    How long can you sit comfortably? has to shift boody weight.    Patient Stated Goals work on the constipation, reduce the discomfort   Currently in Pain? Yes   Pain Score 5    Pain Location Abdomen   Pain Orientation Right;Lower   Pain Descriptors / Indicators Spasm   Pain Type Chronic pain   Pain Onset More than a month ago   Pain Frequency Intermittent   Aggravating Factors  bsing constipated   Pain Relieving Factors stretch, walk it out   Multiple Pain Sites No                      Pelvic Floor Special Questions - 11/26/14 0001    Pelvic Floor Internal Exam Patient confirms identification and approves physical therapist to assess pelvic floor strength and muscle integrity   Exam Type  Vaginal           [facility] Adult PT Treatment/Exercise - 11/26/14 0001    Self-Care   Self-Care Other Self-Care Comments   Other Self-Care Comments  ure to void   Manual Therapy   Manual Therapy Soft tissue mobilization;Myofascial release;Internal Pelvic Floor   Soft tissue mobilization abdominal massage, diaphragm   Myofascial Release lower abdomen   Internal Pelvic Floor soft tissue work to bil. levator ani, righ tobturator internist                PT Education - 11/26/14 [phone]    Education provided Yes   Education Details urge to void   Person(s) Educated Patient   Methods Explanation;Demonstration;Verbal cues;Handout   Comprehension Verbalized understanding;Returned demonstration          PT Short Term Goals - 11/26/14 7588    PT SHORT TERM GOAL #1   Title understand how to perform abdominal massage to improve constipation   Time 4   Period Weeks   Status Achieved   PT SHORT TERM GOAL #2   Title understand correct toileting technique to have a bowel movement   Time 4   Period Weeks   Status Achieved   PT SHORT TERM GOAL #4   Title understand how  to delay urge to void 30 extra minutes   Time 4   Period Weeks   Status On-going  just learned           PT Long Term Goals - 10/30/14 1644    PT LONG TERM GOAL #1   Title independent with HEP and understand how to progress herself   Time 12   Period Weeks   Status New   PT LONG TERM GOAL #2   Title ability to have a bowel movement every 2-3 days due to ability to relax pelvic floor to 3-4 uv when bearing down   Time 12   Period Weeks   Status New   PT LONG TERM GOAL #3   Title lower abdominal pain decreased >/= 75% due to improved bowel habits   Time 12   Period Weeks   Status New   PT LONG TERM GOAL #4   Title ability to urinate every 2 hours due to improved pelvic floor control   Time 12   Period Weeks   Status New               Plan - 11/26/14 0924    Clinical Impression Statement  Patient is a 44 year old female with diagnosis of constipation to outlet dysfunction and pelvic floor dysfunction.  Patient has increased tightness on right  side of pelvic floor.  Patient had increased pain in righ diaphragm.  Patient  has met STG # 2. Patient reports pain is 10% better.  Patient would benefit from physical therapy to improve pelvic floor dysfunction and pain.    Pt will benefit from skilled therapeutic intervention in order to improve on the following deficits Decreased coordination;Increased fascial restricitons;Increased muscle spasms;Decreased endurance;Decreased activity tolerance;Pain;Impaired flexibility;Decreased mobility;Decreased strength   Rehab Potential Good   Clinical Impairments Affecting Rehab Potential None   PT Frequency 1x / week   PT Duration 12 weeks   PT Treatment/Interventions Biofeedback;ADLs/Self Care Home Management;Electrical Stimulation;Moist Heat;Therapeutic exercise;Therapeutic activities;Ultrasound;Neuromuscular re-education;Patient/family education;Manual techniques   PT Next Visit Plan pelvic floor EMG, soft tissue work   PT Home Exercise Plan flexibility exercises   Consulted and Agree with Plan of Care Patient        Problem List Patient Active Problem List   Diagnosis Date Noted  . Abdominal pain, acute 05/23/2012  . Rectal bleeding 05/23/2012  . Nausea and vomiting 05/23/2012  . Dehydration 05/23/2012    Krishawna Stiefel,PT 11/26/2014, 9:30 AM  [facility] [address] [place], [place], [address] Phone: [phone]   Fax:  [phone]

## 2014-12-01 ENCOUNTER — Ambulatory Visit: Payer: No Typology Code available for payment source | Admitting: Physical Therapy

## 2014-12-01 DIAGNOSIS — N8184 Pelvic muscle wasting: Secondary | ICD-10-CM | POA: Diagnosis not present

## 2014-12-01 DIAGNOSIS — M6289 Other specified disorders of muscle: Secondary | ICD-10-CM

## 2014-12-01 NOTE — Therapy (Signed)
[facility] [address] [place], [place], [address] Phone: [phone]   Fax:  [phone]  Physical Therapy Treatment  Patient Details  Name: Mary [name] MRN: [id number] Date of Birth: [birth year] No Data Recorded  Encounter Date: 12/01/2014      PT End of Session - 12/01/14 0806    Visit Number 4   Date for PT Re-Evaluation 01/22/15   PT Start Time 0800   PT Stop Time 0840   PT Time Calculation (min) 40 min   Activity Tolerance Patient tolerated treatment well   Behavior During Therapy [facility] for tasks assessed/performed      Past Medical History  Diagnosis Date  . Migraines     Past Surgical History  Procedure Laterality Date  . Abdominal hysterectomy    . Hernia repair    . Colonoscopy N/A 07/11/2012    Procedure: COLONOSCOPY;  Surgeon: Missy Sabins, MD;  Location: [facility];  Service: Endoscopy;  Laterality: N/A;    There were no vitals filed for this visit.  Visit Diagnosis:  Pelvic floor dysfunction      Subjective Assessment - 12/01/14 0806    Subjective I had a bad weekend. I was at a point that I wanted to go to the bathroom due to feeling so bad.    How long can you sit comfortably? has to shift boody weight.    Patient Stated Goals work on the constipation, reduce the discomfort   Currently in Pain? Yes   Pain Score 8    Pain Location Abdomen   Pain Orientation Right;Upper   Pain Descriptors / Indicators Spasm   Pain Type Chronic pain   Pain Onset More than a month ago   Pain Frequency Intermittent   Aggravating Factors  constipation   Pain Relieving Factors stretch, walk   Multiple Pain Sites No                      Pelvic Floor Special Questions - 12/01/14 0001    Biofeedback initial resting tone 4uv, deep breathing to relax, guided imagery, after decreased to 2 uv   Biofeedback sensor type Rectal   Biofeedback Activity Bulging  relaxtion to decrease  resting tone           [facility] Adult PT Treatment/Exercise - 12/01/14 0001    Manual Therapy   Manual Therapy Soft tissue mobilization;Myofascial release;Internal Pelvic Floor   Soft tissue mobilization abdominal massage, diaphragm   Myofascial Release right upper abdominal area                PT Education - 12/01/14 0841    Education provided Yes   Education Details deep breathing, guided imagery   Person(s) Educated Patient   Methods Explanation;Demonstration   Comprehension Verbalized understanding;Returned demonstration          PT Short Term Goals - 12/01/14 0840    PT SHORT TERM GOAL #1   Title understand how to perform abdominal massage to improve constipation   Time 4   Period Weeks   Status Achieved   PT SHORT TERM GOAL #2   Title understand correct toileting technique to have a bowel movement   Time 4   Period Weeks   Status Achieved   PT SHORT TERM GOAL #3   Title understand how to perform perineal massage to relax pelvic floor   Time 4   Period Weeks   Status New  PT Long Term Goals - 10/30/14 1644    PT LONG TERM GOAL #1   Title independent with HEP and understand how to progress herself   Time 12   Period Weeks   Status New   PT LONG TERM GOAL #2   Title ability to have a bowel movement every 2-3 days due to ability to relax pelvic floor to 3-4 uv when bearing down   Time 12   Period Weeks   Status New   PT LONG TERM GOAL #3   Title lower abdominal pain decreased >/= 75% due to improved bowel habits   Time 12   Period Weeks   Status New   PT LONG TERM GOAL #4   Title ability to urinate every 2 hours due to improved pelvic floor control   Time 12   Period Weeks   Status New               Plan - 12/01/14 0841    Clinical Impression Statement Patient is a 44 year old female with diagnosis of constipation to outlet dysfunction and pelvic floor dysfunction.  Patient starts at a resting tone of 4 uv but after  breathing exercise and guided imagery decreases to 2 uv.  Patient had tightness in right upper quadrant. Patient has not met goals at [facility] time.  Patient was able to control the urge tohave a bowel movemetn till she gets home fro mclass.  Patient will benefit from physical therapy to improvr toileting and decrease pain.    Pt will benefit from skilled therapeutic intervention in order to improve on the following deficits Decreased coordination;Increased fascial restricitons;Increased muscle spasms;Decreased endurance;Decreased activity tolerance;Pain;Impaired flexibility;Decreased mobility;Decreased strength   Rehab Potential Good   Clinical Impairments Affecting Rehab Potential None   PT Frequency 1x / week   PT Treatment/Interventions Biofeedback;ADLs/Self Care Home Management;Electrical Stimulation;Moist Heat;Therapeutic exercise;Therapeutic activities;Ultrasound;Neuromuscular re-education;Patient/family education;Manual techniques   PT Next Visit Plan pelvic floor EMG, soft tissue work   PT Home Exercise Plan YOGA moves   Consulted and Agree with Plan of Care Patient        Problem List Patient Active Problem List   Diagnosis Date Noted  . Abdominal pain, acute 05/23/2012  . Rectal bleeding 05/23/2012  . Nausea and vomiting 05/23/2012  . Dehydration 05/23/2012   GRAY,CHERYL,PT 12/01/2014, 8:48 AM  [facility] [address] [place], [place], [address] Phone: [phone]   Fax:  [phone]  Name: Mary [name] MRN: [id number] Date of Birth: August 20, 1970

## 2014-12-09 ENCOUNTER — Encounter: Payer: No Typology Code available for payment source | Admitting: Physical Therapy

## 2014-12-15 ENCOUNTER — Encounter: Payer: Self-pay | Admitting: Physical Therapy

## 2014-12-15 ENCOUNTER — Ambulatory Visit: Payer: No Typology Code available for payment source | Admitting: Physical Therapy

## 2014-12-15 DIAGNOSIS — M6289 Other specified disorders of muscle: Secondary | ICD-10-CM

## 2014-12-15 DIAGNOSIS — N8184 Pelvic muscle wasting: Secondary | ICD-10-CM | POA: Diagnosis not present

## 2014-12-15 NOTE — Patient Instructions (Signed)
PELVIC PRESSURE: Spinal Twist (Sitting)    Place right foot over left leg. Hold right foot with right hand, place left hand behind buttocks. Inhaling, straighten spine. Exhaling, rotate torso to left. Hold position for _15__ breaths. Switch legs and repeat on other side. Repeat _2__ times, each side. Do __1_ times per day.  Copyright  VHI. All rights reserved.  [facility] [address], [address] [place], [place] [address] Phone # [phone] Fax [phone]

## 2014-12-15 NOTE — Therapy (Signed)
[facility] [address], [address] [place], [place], [address] Phone: [phone]   Fax:  [phone]  Physical Therapy Treatment  Patient Details  Name: Mary [name] MRN: [id number] Date of Birth: 03-20-1970 Referring Provider: Dr. Mechele Claude  Encounter Date: 12/15/2014      PT End of Session - 12/15/14 0805    Visit Number 5   Date for PT Re-Evaluation 01/22/15   PT Start Time 0800   PT Stop Time 0840   PT Time Calculation (min) 40 min   Activity Tolerance Patient tolerated treatment well   Behavior During Therapy [facility] for tasks assessed/performed      Past Medical History  Diagnosis Date  . Migraines     Past Surgical History  Procedure Laterality Date  . Abdominal hysterectomy    . Hernia repair    . Colonoscopy N/A 07/11/2012    Procedure: COLONOSCOPY;  Surgeon: Barrie Folk, MD;  Location: [facility];  Service: Endoscopy;  Laterality: N/A;    There were no vitals filed for this visit.  Visit Diagnosis:  Pelvic floor dysfunction      Subjective Assessment - 12/15/14 0805    Subjective I had 2 bowel movements since last visit.  I am straining less since initial evaluation. I am still having issues with my stomach. I have been having sharp pain under my navel.    How long can you sit comfortably? has to shift boody weight.    Patient Stated Goals work on the constipation, reduce the discomfort   Currently in Pain? Yes   Pain Score 7    Pain Location Abdomen   Pain Orientation Right;Left   Pain Descriptors / Indicators Sharp   Pain Type Chronic pain   Pain Onset More than a month ago   Pain Frequency Intermittent   Aggravating Factors  constipation   Pain Relieving Factors stretch, walk   Multiple Pain Sites No            [facility] PT Assessment - 12/15/14 0001    Assessment   Referring Provider Dr. Mechele Claude   Strength   Right Hip Flexion 5/5   Left Hip Flexion 5/5   Palpation    SI assessment  correct alignment                     [facility] Adult PT Treatment/Exercise - 12/15/14 0001    Manual Therapy   Manual Therapy Myofascial release;Soft tissue mobilization   Soft tissue mobilization abdominal massage, diaphragm   Myofascial Release lower abdominal area to realease intestince and ligaments                PT Education - 12/15/14 0840    Education provided Yes   Education Details sitting trunk rotation   Person(s) Educated Patient   Methods Explanation;Demonstration;Handout   Comprehension Verbalized understanding;Returned demonstration          PT Short Term Goals - 12/15/14 0840    PT SHORT TERM GOAL #1   Title understand how to perform abdominal massage to improve constipation   Time 4   Period Weeks   Status Achieved   PT SHORT TERM GOAL #2   Title understand correct toileting technique to have a bowel movement   Time 4   Period Weeks   Status Achieved   PT SHORT TERM GOAL #3   Title understand how to perform perineal massage to relax pelvic floor   Time 4  Period Weeks   Status New   PT SHORT TERM GOAL #4   Title understand how to delay urge to void 30 extra minutes   Time 4   Period Weeks   Status Achieved           PT Long Term Goals - 10/30/14 1644    PT LONG TERM GOAL #1   Title independent with HEP and understand how to progress herself   Time 12   Period Weeks   Status New   PT LONG TERM GOAL #2   Title ability to have a bowel movement every 2-3 days due to ability to relax pelvic floor to 3-4 uv when bearing down   Time 12   Period Weeks   Status New   PT LONG TERM GOAL #3   Title lower abdominal pain decreased >/= 75% due to improved bowel habits   Time 12   Period Weeks   Status New   PT LONG TERM GOAL #4   Title ability to urinate every 2 hours due to improved pelvic floor control   Time 12   Period Weeks   Status New               Plan - 12/15/14 0841    Clinical  Impression Statement Patient is a 44 year old female with diagnosis of constipation to outlet dysfunction and pelvc floor dysfunction.  Patient is able to have 1 bowel movement per week.  Patient was having sharp pains in lower abdomen area.  After therapy pain decreased to 5/10.  Patient has not met gosla yet but working toward them.  Patient wioo benefit form physical therapy to reduce pain  and decrease constipation.    Pt will benefit from skilled therapeutic intervention in order to improve on the following deficits Decreased coordination;Increased fascial restricitons;Increased muscle spasms;Decreased endurance;Decreased activity tolerance;Pain;Impaired flexibility;Decreased mobility;Decreased strength   Rehab Potential Good   Clinical Impairments Affecting Rehab Potential None   PT Frequency 1x / week   PT Duration 12 weeks   PT Treatment/Interventions Biofeedback;ADLs/Self Care Home Management;Electrical Stimulation;Moist Heat;Therapeutic exercise;Therapeutic activities;Ultrasound;Neuromuscular re-education;Patient/family education;Manual techniques   PT Next Visit Plan pelvic floor EMG, soft tissue work   PT Home Exercise Plan progress as needed   Consulted and Agree with Plan of Care Patient        Problem List Patient Active Problem List   Diagnosis Date Noted  . Abdominal pain, acute 05/23/2012  . Rectal bleeding 05/23/2012  . Nausea and vomiting 05/23/2012  . Dehydration 05/23/2012    GRAY,CHERYL,PT 12/15/2014, 8:44 AM  [facility] [address], [place], [place], [address] Phone: [phone]   Fax:  [phone]  Name: Mary [name] MRN: [id number] Date of Birth: February 01, 1971

## 2014-12-22 ENCOUNTER — Encounter: Payer: Self-pay | Admitting: Physical Therapy

## 2014-12-22 ENCOUNTER — Ambulatory Visit: Payer: No Typology Code available for payment source | Attending: Gastroenterology | Admitting: Physical Therapy

## 2014-12-22 DIAGNOSIS — N8184 Pelvic muscle wasting: Secondary | ICD-10-CM | POA: Diagnosis not present

## 2014-12-22 DIAGNOSIS — M6289 Other specified disorders of muscle: Secondary | ICD-10-CM

## 2014-12-22 NOTE — Therapy (Signed)
[facility] [address], [address] [place], [place], [address] Phone: [phone]   Fax:  [phone]  Physical Therapy Treatment  Patient Details  Name: Mary [name] MRN: [id number] Date of Birth: [birth year] Referring Provider: Dr. Mechele ClaudeYolanda Scarlett  Encounter Date: 12/22/2014      PT End of Session - 12/22/14 0807    Visit Number 6   Date for PT Re-Evaluation 01/22/15   PT Start Time 0806   PT Stop Time 0845   PT Time Calculation (min) 39 min   Activity Tolerance Patient tolerated treatment well   Behavior During Therapy [facility] for tasks assessed/performed      Past Medical History  Diagnosis Date  . Migraines     Past Surgical History  Procedure Laterality Date  . Abdominal hysterectomy    . Hernia repair    . Colonoscopy N/A 07/11/2012    Procedure: COLONOSCOPY;  Surgeon: Barrie FolkJohn C Hayes, MD;  [facility];  Service: Endoscopy;  Laterality: N/A;    There were no vitals filed for this visit.  Visit Diagnosis:  Pelvic floor dysfunction      Subjective Assessment - 12/22/14 0807    Subjective I am very uncomfortable.  I had 2 bowel movements since last visit. I had to self medicate myself last friday due to having alot of cramping.  The spasms and pain coming alot. I am back straining.  I have alot of pressuer in the vaginal area.    How long can you sit comfortably? has to shift boody weight.    Patient Stated Goals work on the constipation, reduce the discomfort   Currently in Pain? Yes   Pain Score 7    Pain Location Abdomen   Pain Orientation Right;Left   Pain Descriptors / Indicators Sharp;Shooting;Discomfort   Pain Type Chronic pain   Pain Onset More than a month ago   Pain Frequency Intermittent   Aggravating Factors  constipation    Pain Relieving Factors stretch, walk   Multiple Pain Sites No                      Pelvic Floor Special Questions - 12/22/14 0001     Biofeedback initial resting tone is 5uv in right sidely   Biofeedback sensor type Rectal   Biofeedback Activity 10 second hold;Other;Bulging  relaxation; using heat on abdomen and hands           [facility] Adult PT Treatment/Exercise - 12/22/14 0001    Modalities   Modalities Moist Heat   Moist Heat Therapy   Number Minutes Moist Heat 15 Minutes   Moist Heat Location Other (comment)  abdomen in right sidely while doing pelvic floor EMG                PT Education - 12/22/14 0839    Education provided Yes   Education Details while having a bowel movement and press on the perineal body to assist in pushing a bowel movement out   Person(s) Educated Patient   Methods Explanation;Demonstration   Comprehension Verbalized understanding;Returned demonstration          PT Short Term Goals - 12/22/14 0838    PT SHORT TERM GOAL #1   Title understand how to perform abdominal massage to improve constipation   Time 4   Period Weeks   Status Achieved   PT SHORT TERM GOAL #2   Title understand correct toileting technique to have a bowel movement  Time 4   Period Weeks   Status Achieved   PT SHORT TERM GOAL #3   Title understand how to perform perineal massage to relax pelvic floor   Time 4   Period Weeks   Status Achieved   PT SHORT TERM GOAL #4   Title understand how to delay urge to void 30 extra minutes   Time 4   Period Weeks   Status Achieved           PT Long Term Goals - 10/30/14 1644    PT LONG TERM GOAL #1   Title independent with HEP and understand how to progress herself   Time 12   Period Weeks   Status New   PT LONG TERM GOAL #2   Title ability to have a bowel movement every 2-3 days due to ability to relax pelvic floor to 3-4 uv when bearing down   Time 12   Period Weeks   Status New   PT LONG TERM GOAL #3   Title lower abdominal pain decreased >/= 75% due to improved bowel habits   Time 12   Period Weeks   Status New   PT LONG TERM GOAL #4    Title ability to urinate every 2 hours due to improved pelvic floor control   Time 12   Period Weeks   Status New               Plan - 12/22/14 0840    Clinical Impression Statement Patient is a 45 year old female with diagnosis fo constipation to outlet dysfunction and pelvic floor dysfunction.  Patient resting level stated out as 6 uv.  After heat to abdomen and hand resting level decreased to 2-3 uv.  Patient has difficulty with relaxing pelvic floor when bulging the perineum as she is having a bowel movement.  After pelvic EMG and verbal cues she was able to perform but will need practice. Patient will benefit fromp physical therapy to improve relaxation of pelvic floor with bowel movement.    Pt will benefit from skilled therapeutic intervention in order to improve on the following deficits Decreased coordination;Increased fascial restricitons;Increased muscle spasms;Decreased endurance;Decreased activity tolerance;Pain;Impaired flexibility;Decreased mobility;Decreased strength   Rehab Potential Good   Clinical Impairments Affecting Rehab Potential None   PT Frequency 1x / week   PT Duration 12 weeks   PT Treatment/Interventions Biofeedback;ADLs/Self Care Home Management;Electrical Stimulation;Moist Heat;Therapeutic exercise;Therapeutic activities;Ultrasound;Neuromuscular re-education;Patient/family education;Manual techniques   PT Next Visit Plan pelvic floor EMG, soft tissue work; instruction on home TENS unit   PT Home Exercise Plan progress as needed   Consulted and Agree with Plan of Care Patient        Problem List Patient Active Problem List   Diagnosis Date Noted  . Abdominal pain, acute 05/23/2012  . Rectal bleeding 05/23/2012  . Nausea and vomiting 05/23/2012  . Dehydration 05/23/2012    GRAY,CHERYL,PT 12/22/2014, 8:46 AM  [facility] [address], [address] [place], [place], [address] Phone: [phone]    Fax:  [phone]  Name: Mary [name] MRN: [id number] Date of Birth: 31-Dec-1970

## 2014-12-29 ENCOUNTER — Ambulatory Visit: Payer: No Typology Code available for payment source | Admitting: Physical Therapy

## 2014-12-29 ENCOUNTER — Encounter: Payer: Self-pay | Admitting: Physical Therapy

## 2014-12-29 DIAGNOSIS — N8184 Pelvic muscle wasting: Secondary | ICD-10-CM | POA: Diagnosis not present

## 2014-12-29 DIAGNOSIS — M6289 Other specified disorders of muscle: Secondary | ICD-10-CM

## 2014-12-29 NOTE — Patient Instructions (Signed)
Achilles Squat    Stand, feet hip-width apart, slightly turned out. Squat as low as possible, keeping feet flat on floor. Feel stretch in heel. If possible, place elbows inside knees, palms together. Hold 30___ seconds. Beginner: Grasp a table leg while squatting to hold pose. Advanced: Feet close together, wrap arms around shins. Repeat __1_ times per session. Do _3__ sessions per day.  Copyright  VHI. All rights reserved.   Soft tissue work to pelvic floor using the vaginal massager sweeping like you are cleaning our a peanut butter jar for 2 minutes each side.   [facility] [address], [address] [place], [place] [address] Phone # [phone] Fax [phone]

## 2014-12-29 NOTE — Therapy (Signed)
[facility] [address] [place], [place], [address] Phone: [phone]   Fax:  [phone]  Physical Therapy Treatment  Patient Details  Name: Mary [name] MRN: [id number] Date of Birth: 16-Jul-1970 Referring Provider: Dr. Marcelyn Ditty  Encounter Date: 12/29/2014      PT End of Session - 12/29/14 0935    Visit Number 7   Date for PT Re-Evaluation 01/22/15   PT Start Time 0934   PT Stop Time 1020   PT Time Calculation (min) 46 min   Activity Tolerance Patient tolerated treatment well   Behavior During Therapy [facility] for tasks assessed/performed      Past Medical History  Diagnosis Date  . Migraines     Past Surgical History  Procedure Laterality Date  . Abdominal hysterectomy    . Hernia repair    . Colonoscopy N/A 07/11/2012    Procedure: COLONOSCOPY;  Surgeon: Missy Sabins, MD;  [facility];  Service: Endoscopy;  Laterality: N/A;    There were no vitals filed for this visit.  Visit Diagnosis:  Pelvic floor dysfunction      Subjective Assessment - 12/29/14 0935    Subjective I brought my TENS unit.  I have not gone to the bathroom since last week. I have been doing the breathing technique. I have a new boil in lower abdomen.    How long can you sit comfortably? has to shift boody weight.    Patient Stated Goals work on the constipation, reduce the discomfort   Currently in Pain? Yes   Pain Score 1    Pain Location Abdomen   Pain Orientation Right;Left   Pain Descriptors / Indicators Discomfort  uncomfortable   Pain Type Chronic pain   Pain Frequency Intermittent   Aggravating Factors  constipation   Pain Relieving Factors stetch, walk   Multiple Pain Sites No                      Pelvic Floor Special Questions - 12/29/14 0001    Pelvic Floor Internal Exam Patient confirms identification and approves physical therapist to assess pelvic floor strength and muscle  integrity   Exam Type Vaginal           [facility] Adult PT Treatment/Exercise - 12/29/14 0001    Self-Care   Self-Care Other Self-Care Comments   Other Self-Care Comments  use her home TENS unit on abdomen with discomfort.     Manual Therapy   Manual Therapy Internal Pelvic Floor   Internal Pelvic Floor bil. obturator internist with hip movement, bil. puborectalis, bil. coccygeus, bil. iliococcygeus in hookly                PT Education - 12/29/14 1012    Education provided Yes   Education Details instruction on how to perform soft tissue work to vaginal wall at home with her hand or vaginal massager; squat   Person(s) Educated Patient   Methods Explanation;Demonstration;Handout   Comprehension Verbalized understanding;Returned demonstration          PT Short Term Goals - 12/29/14 0937    PT SHORT TERM GOAL #1   Title understand how to perform abdominal massage to improve constipation   Time 4   Period Weeks   Status Achieved   PT SHORT TERM GOAL #2   Title understand correct toileting technique to have a bowel movement   Time 4   Period Weeks   Status Achieved  PT SHORT TERM GOAL #3   Title understand how to perform perineal massage to relax pelvic floor   Time 4   Period Weeks   Status Achieved   PT SHORT TERM GOAL #4   Title understand how to delay urge to void 30 extra minutes   Time 4   Period Weeks   Status Achieved           PT Long Term Goals - 12/29/14 1013    PT LONG TERM GOAL #1   Title independent with HEP and understand how to progress herself   Time 12   Period Weeks   Status On-going  learning exercises   PT LONG TERM GOAL #2   Title ability to have a bowel movement every 2-3 days due to ability to relax pelvic floor to 3-4 uv when bearing down   Time 12   Period Weeks   Status On-going  1 per week   PT LONG TERM GOAL #3   Title lower abdominal pain decreased >/= 75% due to improved bowel habits   Time 12   Period Weeks    Status On-going  15%  reduction   PT LONG TERM GOAL #4   Title ability to urinate every 2 hours due to improved pelvic floor control   Time 12   Period Weeks   Status Achieved               Plan - 12/29/14 1022    Clinical Impression Statement Patient is a 44 year old female with diagnosis of constipation to outlet dysfunction and pelvic floor dysfunction.  Patient has learned how to perform self soft tissue work to pelvic floor muscles due to many trigger points making it difficult to relax the msucles for a bowel movement. Patient has met all of her STG's.  Patient continues ot have difficult with bowel movments.  Patient would benefit from physical therapy to improve bowel movement, relax pelvic floor and improve pelvic floor coordination.    Pt will benefit from skilled therapeutic intervention in order to improve on the following deficits Decreased coordination;Increased fascial restricitons;Increased muscle spasms;Decreased endurance;Decreased activity tolerance;Pain;Impaired flexibility;Decreased mobility;Decreased strength   Rehab Potential Good   Clinical Impairments Affecting Rehab Potential None   PT Frequency 1x / week   PT Duration 12 weeks   PT Treatment/Interventions Biofeedback;ADLs/Self Care Home Management;Electrical Stimulation;Moist Heat;Therapeutic exercise;Therapeutic activities;Ultrasound;Neuromuscular re-education;Patient/family education;Manual techniques   PT Next Visit Plan pelvic floor EMG, soft tissue work   PT Home Exercise Plan progress as needed   Consulted and Agree with Plan of Care Patient        Problem List Patient Active Problem List   Diagnosis Date Noted  . Abdominal pain, acute 05/23/2012  . Rectal bleeding 05/23/2012  . Nausea and vomiting 05/23/2012  . Dehydration 05/23/2012    GRAY,CHERYL,PT 12/29/2014, 10:26 AM  [facility] [address], [place], [place],  [address] Phone: [phone]   Fax:  [phone]  Name: Mary [name] MRN: [id number] Date of Birth: [birth year]

## 2014-12-30 ENCOUNTER — Encounter: Payer: No Typology Code available for payment source | Admitting: Physical Therapy

## 2015-01-05 ENCOUNTER — Encounter: Payer: Self-pay | Admitting: Physical Therapy

## 2015-01-05 ENCOUNTER — Ambulatory Visit: Payer: No Typology Code available for payment source | Admitting: Physical Therapy

## 2015-01-05 DIAGNOSIS — M6289 Other specified disorders of muscle: Secondary | ICD-10-CM

## 2015-01-05 DIAGNOSIS — N8184 Pelvic muscle wasting: Secondary | ICD-10-CM | POA: Diagnosis not present

## 2015-01-05 NOTE — Therapy (Signed)
[facility] [address], [address] [place], [place], [address] Phone: [phone]   Fax:  [phone]  Physical Therapy Treatment  Patient Details  Name: Mary [name] MRN: [id number] Date of Birth: Jun 26, 1970 Referring Provider: Dr. Mechele ClaudeYolanda Scarlett  Encounter Date: 01/05/2015      PT End of Session - 01/05/15 0806    Visit Number 8   PT Start Time 0805   PT Stop Time 0845   PT Time Calculation (min) 40 min   Activity Tolerance Patient tolerated treatment well   Behavior During Therapy [facility] for tasks assessed/performed      Past Medical History  Diagnosis Date  . Migraines     Past Surgical History  Procedure Laterality Date  . Abdominal hysterectomy    . Hernia repair    . Colonoscopy N/A 07/11/2012    Procedure: COLONOSCOPY;  Surgeon: Barrie FolkJohn C Hayes, MD;  [facility];  Service: Endoscopy;  Laterality: N/A;    There were no vitals filed for this visit.  Visit Diagnosis:  Pelvic floor dysfunction      Subjective Assessment - 01/05/15 0806    Subjective I have had 3 bowel movements.  Patient did not have to strain with the bowel movement.    How long can you sit comfortably? has to shift boody weight.    Patient Stated Goals work on the constipation, reduce the discomfort   Currently in Pain? Yes   Pain Score 4    Pain Location Abdomen   Pain Orientation Right;Left   Pain Descriptors / Indicators Discomfort  uncomfortable   Pain Type Chronic pain   Pain Onset More than a month ago   Pain Frequency Intermittent   Aggravating Factors  constipation   Pain Relieving Factors stretch, walk   Multiple Pain Sites No                      Pelvic Floor Special Questions - 01/05/15 0001    Pelvic Floor Internal Exam Patient confirms identification and approves physical therapist to assess pelvic floor strength and muscle integrity   Exam Type Vaginal   Strength --  5/5 vaginally            [facility] Adult PT Treatment/Exercise - 01/05/15 0001    Manual Therapy   Manual Therapy Internal Pelvic Floor   Internal Pelvic Floor bil. obturator internist with hip movement, bil. puborectalis, bil. coccygeus, bil. iliococcygeus in hookly                PT Education - 01/05/15 0834    Education provided No          PT Short Term Goals - 12/29/14 0937    PT SHORT TERM GOAL #1   Title understand how to perform abdominal massage to improve constipation   Time 4   Period Weeks   Status Achieved   PT SHORT TERM GOAL #2   Title understand correct toileting technique to have a bowel movement   Time 4   Period Weeks   Status Achieved   PT SHORT TERM GOAL #3   Title understand how to perform perineal massage to relax pelvic floor   Time 4   Period Weeks   Status Achieved   PT SHORT TERM GOAL #4   Title understand how to delay urge to void 30 extra minutes   Time 4   Period Weeks   Status Achieved  PT Long Term Goals - 01/05/15 1610    PT LONG TERM GOAL #1   Title independent with HEP and understand how to progress herself   Time 12   Period Weeks   Status On-going  still learning   PT LONG TERM GOAL #2   Title ability to have a bowel movement every 2-3 days due to ability to relax pelvic floor to 3-4 uv when bearing down   Time 12   Period Weeks   Status On-going  3 bowel movements   PT LONG TERM GOAL #3   Title lower abdominal pain decreased >/= 75% due to improved bowel habits   Time 12   Period Weeks   Status On-going  50% better   PT LONG TERM GOAL #4   Title ability to urinate every 2 hours due to improved pelvic floor control   Time 12   Period Weeks   Status Achieved               Plan - 01/05/15 0834    Clinical Impression Statement Patient is a 44 year old female with diagnosis of constipation to outlet dysfunction and pelvic floor dysfunction.  Patient reports lower abdominal pain decreased by 50%.  Patient had  3 bowel movements since last visit which is an improvement. Pelvic floor muscles were softer. Patient reports less tenderness located in pelvic floor muscles during soft tissue  work. VAginal strength is 5/5. Patient would bnefit from physical therapy to improve bowel movement, relax pelvic floor coordination.    Pt will benefit from skilled therapeutic intervention in order to improve on the following deficits Decreased coordination;Increased fascial restricitons;Increased muscle spasms;Decreased endurance;Decreased activity tolerance;Pain;Impaired flexibility;Decreased mobility;Decreased strength   Clinical Impairments Affecting Rehab Potential None   PT Frequency 1x / week   PT Duration 12 weeks   PT Treatment/Interventions Biofeedback;ADLs/Self Care Home Management;Electrical Stimulation;Moist Heat;Therapeutic exercise;Therapeutic activities;Ultrasound;Neuromuscular re-education;Patient/family education;Manual techniques   PT Next Visit Plan pelvic floor EMG, soft tissue work   PT Home Exercise Plan progress as needed   Consulted and Agree with Plan of Care Patient        Problem List Patient Active Problem List   Diagnosis Date Noted  . Abdominal pain, acute 05/23/2012  . Rectal bleeding 05/23/2012  . Nausea and vomiting 05/23/2012  . Dehydration 05/23/2012    Shilynn Hoch,PT 01/05/2015, 8:39 AM  [facility] [address], [address] [place], [place], [address] Phone: [phone]   Fax:  [phone]  Name: Mary [name] MRN: [id number] Date of Birth: 1970-07-05

## 2015-01-13 ENCOUNTER — Ambulatory Visit: Payer: No Typology Code available for payment source | Admitting: Physical Therapy

## 2015-01-13 ENCOUNTER — Encounter: Payer: Self-pay | Admitting: Physical Therapy

## 2015-01-13 DIAGNOSIS — N8184 Pelvic muscle wasting: Secondary | ICD-10-CM | POA: Diagnosis not present

## 2015-01-13 DIAGNOSIS — M6289 Other specified disorders of muscle: Secondary | ICD-10-CM

## 2015-01-13 NOTE — Patient Instructions (Signed)
Scar Massage  Scar massage is done to improve the mobility of scar, decrease scar tissue from building up, reduce adhesions, and prevent Keloids from forming. Start scar massage after scabs have fallen off by themselves and no open areas. The first few weeks after surgery, it is normal for a scar to appear pink or red and slightly raised. Scars can itch or have areas of numbness. Some scars may be sensitive.   Direct Scar massage: after scar is healed, no opening, no scab 1.  Place pads of two fingers together directly on the scar starting at one end of the scar. Move the fingers up and down across the scar holding 5 seconds one direction.  Then go opposite direction hold 5 seconds.  2. Move over to the next section of the scar and repeat.  Work your way along the entire length of the scar.   3. Next make diagonal movements along the scar holding 5 seconds at one direction. 4. Next movement is side to side. 5. Do not rub fingers over the scar.  Instead keep firm pressure and move scar over the tissue it is on top   Scar Lift and Roll 12 weeks after surgery. 1. Pinch a small amount of the scar between your first two fingers and thumb.  2. Roll the scar between your fingers for 5 to 15 seconds. 3. Move along the scar and repeat until you have massaged the entire length of scar.   Stop the massage and call your doctor if you notice: 1. Increased redness 2. Bleeding from scar 3. Seepage coming from the scar 4. Scar is warmer and has increased pain   [facility] [address], [address] [place], [place] [address] Phone # [phone] Fax [phone]  

## 2015-01-13 NOTE — Therapy (Signed)
[facility] [address], [address] [place], [place], [address] Phone: [phone]   Fax:  [phone]  Physical Therapy Treatment  Patient Details  Name: Mary [name] MRN: [id number] Date of Birth: 01/04/1971 Referring Provider: Dr. Mechele ClaudeYolanda Scarlett  Encounter Date: 01/13/2015      PT End of Session - 01/13/15 0809    Visit Number 9   Date for PT Re-Evaluation 01/22/15   PT Start Time 0800   PT Stop Time 0840   PT Time Calculation (min) 40 min   Activity Tolerance Patient tolerated treatment well   Behavior During Therapy [facility] for tasks assessed/performed      Past Medical History  Diagnosis Date  . Migraines     Past Surgical History  Procedure Laterality Date  . Abdominal hysterectomy    . Hernia repair    . Colonoscopy N/A 07/11/2012    Procedure: COLONOSCOPY;  Surgeon: Barrie FolkJohn C Hayes, MD;  Location: [facility];  Service: Endoscopy;  Laterality: N/A;    There were no vitals filed for this visit.  Visit Diagnosis:  Pelvic floor dysfunction      Subjective Assessment - 01/13/15 0805    Subjective I spoke to Dr. Mechele ClaudeYolanda Scarlett office and have an appointment on 02/04/2015.  They are waiting for your notes.  I have had 5 bowel movements since last Monday. I am still having spasms.    How long can you sit comfortably? has to shift boody weight.    Patient Stated Goals work on the constipation, reduce the discomfort   Currently in Pain? Yes   Pain Score 4    Pain Location Abdomen   Pain Orientation Right;Left   Pain Descriptors / Indicators Discomfort   Pain Type Chronic pain   Pain Onset More than a month ago   Pain Frequency Intermittent   Aggravating Factors  constipation   Pain Relieving Factors stretch, walk   Multiple Pain Sites No                      Pelvic Floor Special Questions - 01/13/15 0001    Pelvic Floor Internal Exam Patient confirms identification and approves physical  therapist to assess pelvic floor strength and muscle integrity   Exam Type Vaginal           [facility] Adult PT Treatment/Exercise - 01/13/15 0001    Lumbar Exercises: Stretches   Active Hamstring Stretch 2 reps;30 seconds   Lower Trunk Rotation 2 reps;30 seconds  both sides, sitting   Piriformis Stretch Limitations butterfly stretch sitting, hip adductor stretch in sitting   Manual Therapy   Manual Therapy Internal Pelvic Floor   Manual therapy comments manually stretched bil. hip flexors and hip rotators   Internal Pelvic Floor bil. obturator internist with hip movement, bil. puborectalis, bil. coccygeus, bil. iliococcygeus in hookly                PT Education - 01/13/15 0839    Education provided Yes   Education Details scar massage for after future surgery   Person(s) Educated Patient   Methods Explanation;Demonstration;Handout   Comprehension Returned demonstration;Verbalized understanding          PT Short Term Goals - 12/29/14 0937    PT SHORT TERM GOAL #1   Title understand how to perform abdominal massage to improve constipation   Time 4   Period Weeks   Status Achieved   PT SHORT TERM GOAL #2   Title  understand correct toileting technique to have a bowel movement   Time 4   Period Weeks   Status Achieved   PT SHORT TERM GOAL #3   Title understand how to perform perineal massage to relax pelvic floor   Time 4   Period Weeks   Status Achieved   PT SHORT TERM GOAL #4   Title understand how to delay urge to void 30 extra minutes   Time 4   Period Weeks   Status Achieved           PT Long Term Goals - 01/13/15 1610    PT LONG TERM GOAL #1   Title independent with HEP and understand how to progress herself   Time 12   Period Weeks   Status On-going   PT LONG TERM GOAL #2   Title ability to have a bowel movement every 2-3 days due to ability to relax pelvic floor to 3-4 uv when bearing down   Time 12   Period Weeks   Status Achieved   PT  LONG TERM GOAL #3   Title lower abdominal pain decreased >/= 75% due to improved bowel habits   Time 12   Period Weeks   Status On-going   PT LONG TERM GOAL #4   Title ability to urinate every 2 hours due to improved pelvic floor control   Time 12   Period Weeks   Status Achieved               Plan - 01/13/15 0840    Clinical Impression Statement Patient is a 44 year old female with diagnosis of consitpatient to outlet dysfunction and pelvic floor dysfunction.  Patient not abel to perform pelvic floor EMG due to unit being fixed.  Patient is able to have 5 bowel movements in 8 days.  Patient is independent with her current HEP.  Patient will benefit from physical therapy to prepare for abdominal surgery and rlax pelvic floor.    Pt will benefit from skilled therapeutic intervention in order to improve on the following deficits Decreased coordination;Increased fascial restricitons;Increased muscle spasms;Decreased endurance;Decreased activity tolerance;Pain;Impaired flexibility;Decreased mobility;Decreased strength   Clinical Impairments Affecting Rehab Potential None   PT Frequency 1x / week   PT Duration 12 weeks   PT Next Visit Plan possible EMG, abdominal exercise, soft tissue work, bed mobility for after surgery; renewal note   PT Home Exercise Plan bed mobility   Consulted and Agree with Plan of Care Patient        Problem List Patient Active Problem List   Diagnosis Date Noted  . Abdominal pain, acute 05/23/2012  . Rectal bleeding 05/23/2012  . Nausea and vomiting 05/23/2012  . Dehydration 05/23/2012    GRAY,CHERYL,PT 01/13/2015, 8:44 AM  [facility] [address], [address] [place], [place], [address] Phone: [phone]   Fax:  [phone]  Name: Mary [name] MRN: [id number] Date of Birth: September 16, 1970

## 2015-01-19 ENCOUNTER — Ambulatory Visit: Payer: No Typology Code available for payment source | Attending: Gastroenterology | Admitting: Physical Therapy

## 2015-01-19 ENCOUNTER — Encounter: Payer: Self-pay | Admitting: Physical Therapy

## 2015-01-19 DIAGNOSIS — N8184 Pelvic muscle wasting: Secondary | ICD-10-CM | POA: Diagnosis present

## 2015-01-19 DIAGNOSIS — M6289 Other specified disorders of muscle: Secondary | ICD-10-CM

## 2015-01-19 NOTE — Therapy (Signed)
[facility] [address] [place], [place], [address] Phone: [phone]   Fax:  [phone]  Physical Therapy Treatment  Patient Details  Name: Mary [name] MRN: [id number] Date of Birth: May 03, 1970 Referring Provider: Dr. Marcelyn Ditty  Encounter Date: 01/19/2015      PT End of Session - 01/19/15 0803    Visit Number 10   Date for PT Re-Evaluation 03/05/15   PT Start Time 0800   PT Stop Time 0845   PT Time Calculation (min) 45 min   Activity Tolerance Patient tolerated treatment well   Behavior During Therapy [facility] for tasks assessed/performed      Past Medical History  Diagnosis Date  . Migraines     Past Surgical History  Procedure Laterality Date  . Abdominal hysterectomy    . Hernia repair    . Colonoscopy N/A 07/11/2012    Procedure: COLONOSCOPY;  Surgeon: Missy Sabins, MD;  Location: [facility];  Service: Endoscopy;  Laterality: N/A;    There were no vitals filed for this visit.  Visit Diagnosis:  Pelvic floor dysfunction - Plan: PT plan of care cert/re-cert      Subjective Assessment - 01/19/15 0805    Subjective I used the rest room 3 times without squatty potty and felt complete. I see the doctore on 02/04/2015.    How long can you sit comfortably? has to shift boody weight.    Patient Stated Goals work on the constipation, reduce the discomfort   Currently in Pain? 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 Marcelyn Ditty    Onset Date/Surgical Date 02/15/12   Prior Therapy None   Precautions   Precautions None   Balance Screen   Has the patient fallen in the past 6 months No   Has the patient had a decrease in activity level because of a fear of falling?  No   Is the patient reluctant to leave their home because of a fear of falling?  No   Prior Function   Level of Independence Independent   Vocation Full time employment   Vocation Requirements sitting   Cognition   Overall Cognitive Status Within Functional Limits for tasks assessed   Observation/Other Assessments   Focus on Therapeutic Outcomes (FOTO)  45% limitaiton for Bowel CNST survey   ROM / Strength   AROM / PROM / Strength AROM;Strength   AROM   Overall AROM Comments Lumbar ROM is full   Strength   Right Hip Flexion 5/5   Left Hip Flexion 5/5                  Pelvic Floor Special Questions - 01/19/15 0001    Pelvic Floor Internal Exam Patient confirms identification and approves physical therapist to assess pelvic floor strength and muscle integrity   Exam Type Vaginal   Palpation left side was tighter that right side during internal soft tissue work  Strength strong squeeze, against strong resistance  vaginally           [facility] Adult PT Treatment/Exercise - 01/19/15 0001    Manual Therapy   Manual Therapy Internal Pelvic Floor   Internal Pelvic Floor bil. obturator internist with hip movement, bil. puborectalis, bil. coccygeus, bil. iliococcygeus in hookly                PT Education - 01/19/15 0851    Education provided Yes   Education Details tips to conserve energy, correct body mechanics to decrease strain on abdomen, how to prepare with meals and food shop for after surgery   Person(s) Educated Patient   Methods Explanation;Demonstration;Handout   Comprehension Returned demonstration;Verbalized understanding          PT Short Term Goals - 01/19/15 0807    PT SHORT TERM GOAL #1   Title understand  how to perform abdominal massage to improve constipation   Time 4   Period Weeks   Status Achieved   PT SHORT TERM GOAL #2   Title understand correct toileting technique to have a bowel movement   Time 4   Period Weeks   Status Achieved   PT SHORT TERM GOAL #3   Title understand how to perform perineal massage to relax pelvic floor   Time 4   Period Weeks   Status Achieved   PT SHORT TERM GOAL #4   Title understand how to delay urge to void 30 extra minutes   Time 4   Period Weeks   Status Achieved           PT Long Term Goals - 01/19/15 0809    PT LONG TERM GOAL #1   Title independent with HEP and understand how to progress herself   Time 12   Period Weeks   Status On-going  still learning   PT LONG TERM GOAL #2   Title ability to have a bowel movement every 2-3 days due to ability to relax pelvic floor to 3-4 uv when bearing down   Time 12   Period Weeks   Status Achieved   PT LONG TERM GOAL #3   Title lower abdominal pain decreased >/= 75% due to improved bowel habits   Time 12   Period Weeks   Status On-going  60% better   PT LONG TERM GOAL #4   Title ability to urinate every 2 hours due to improved pelvic floor control   Time 12   Period Weeks   Status Achieved   PT LONG TERM GOAL #5   Title understand ways to to protect her abdominal with correct body mechanics for after surgery   Time 6   Period Weeks   Status New   Additional Long Term Goals   Additional Long Term Goals Yes   PT LONG TERM GOAL #6   Title understand ways to prepare for abdominal surgery with conserving energy.    Time 6   Period Weeks   Status New               Plan - 01/19/15 0858    Clinical Impression Statement Patient is a  44 year old female with diagnosis of constipation to outlet dysfunction and pelvic floor dysfunction.  Bil. hip strength is 5/5.  Pelvic floor strength is 5/5. FOTO score has improved to 45% limitaiton.  Patient has 3-5 bowel movements in 8 days.   Patient is consistently performing soft tissue work internally to pelvic floor.  Pateint reports lower abdominal  discomfort decreased by 40%.  Patient has learning ways to recover from abdominal  surgery.  Patient has met all of her STG and LTG # 2,4.  Added LTG to be educated on care after lower abdominal surgery.  Patient has not been able to use the pelvic floor EMG due to machine being fixed.  Paitent will benefit from physical therapy to improve muscle relaxation for bowel movements.    Pt will benefit from skilled therapeutic intervention in order to improve on the following deficits Decreased coordination;Increased fascial restricitons;Increased muscle spasms;Decreased endurance;Decreased activity tolerance;Pain;Impaired flexibility;Decreased mobility;Decreased strength   Rehab Potential Good   Clinical Impairments Affecting Rehab Potential None   PT Frequency 1x / week   PT Duration 6 weeks   PT Treatment/Interventions Biofeedback;ADLs/Self Care Home Management;Electrical Stimulation;Moist Heat;Therapeutic exercise;Therapeutic activities;Ultrasound;Neuromuscular re-education;Patient/family education;Manual techniques   PT Next Visit Plan possible EMG, abdominal exercise, soft tissue work,    PT Home Exercise Plan progress as needed   Consulted and Agree with Plan of Care Patient        Problem List Patient Active Problem List   Diagnosis Date Noted  . Abdominal pain, acute 05/23/2012  . Rectal bleeding 05/23/2012  . Nausea and vomiting 05/23/2012  . Dehydration 05/23/2012    Richard Ritchey,PT 01/19/2015, 9:07 AM  [facility] [address] [place], [place], [address] Phone: [phone]   Fax:  [phone]  Name: Mary [name] MRN: [id number] Date of Birth: 05-25-1970

## 2015-01-19 NOTE — Patient Instructions (Signed)
Try these techniques to guard against pain after abdominal surgery: . Squeeze a pillow to your chest when coughing. . To get out of bed, squeeze a pillow to your chest and roll onto your side first.  Then, push up on your elbow to sit up while still hugging the pillow with the other arm. . To move to the edge of the bed, continue to hug the pillow while you scoot your hips to the edge of the bed. . If you must use your hands to assist with standing up, place your hands on your knees, lean forward, and stand up using your legs primarily.  Place as little weight on your arms as possible. Eulis Fosterheryl Gray, PT - [facility] [address], [address], [place] [address], [phone]   Tips for Energy Conservation for Activities of Daily Living . Plan ahead to avoid rushing. . Sit down to bathe and dry off. Wear a terry robe instead of drying off. . Use a shower/bath organizer to decrease leaning and reaching. . Use extension handles on sponges and brushes. Lilian Kapur. Install grab rails in the bathroom or use an elevated toilet seat. Geanie Cooley. Lay out clothes and toiletries before dressing. . Minimize leaning over to put on clothes and shoes. Bring your foot to your knee to apply socks and shoes. . Wear comfortable shoes and low-heeled, slip on shoes. Wear button front shirts rather than pullovers. Housekeeping . Schedule household tasks throughout the week. . Do housework sitting down when possible. . Delegate heavy housework, shopping, laundry and child care when possible. . Drag or slide objects rather than lifting. . Sit when ironing and take rest periods. . Stop working before becoming overly tired. Shopping . Organize list by aisle. . Use a grocery cart for support. Marland Kitchen. Shop at less busy times. . Ask for help with getting to the car. Meal Preparation . Use convenience and easy-to-prepare foods. . Use small appliances that take less effort to use. Marland Kitchen. Prepare  meals sitting down. . Soak dishes instead of scrubbing and let dishes air dry. . Prepare double portions and freeze half. Child Care . Plan activities that can be done sitting down, such as drawing pictures, playing games, reading, and computer games. . Encourage children to climb up onto your lap or into the highchair instead of being lifted. . Make a game of the household chores so that children will want to help. . Delegate child care when possible.  Eulis Fosterheryl Gray, PT, [facility]; [address], [address] [place], [place] [address]  [employer] delivers food,  Check [employer] food store to see if they deliver.  Sleeping on Side    Place pillow between knees. Use cervical support under neck and a roll around waist as needed.   Copyright  VHI. All rights reserved.    Posture - Sitting    Sit upright, head facing forward. Try using a roll to support lower back. Keep shoulders relaxed, and avoid rounded back. Keep hips level with knees. Avoid crossing legs for long periods.   Copyright  VHI. All rights reserved.  Getting Into / Out of Bed    Lower self to lie down on one side by raising legs and lowering head at the same time. Use arms to assist moving without twisting. Bend both knees to roll onto back if desired. To sit up, start from lying on side, and use same move-ments in reverse. Keep  trunk aligned with legs.   Copyright  VHI. All rights reserved.  Log Roll    Lying on back, bend left knee and place left arm across chest. Roll all in one movement to the right. Reverse to roll to the left. Always move as one unit.   Copyright  VHI. All rights reserved.  Getting Into / Out of Car    Lower self onto seat, scoot back, then bring in one leg at a time. Reverse sequence to get out.   Copyright  VHI. All rights reserved.  Stand to Sit / Sit to Stand    To sit: Bend knees to lower self onto front edge of chair, then scoot  back on seat. To stand: Reverse sequence by placing one foot forward, and scoot to front of seat. Use rocking motion to stand up.  Copyright  VHI. All rights reserved.   [facility] [address] [place], [place] [address] Phone # [phone] Fax [phone]

## 2015-01-27 ENCOUNTER — Ambulatory Visit: Payer: No Typology Code available for payment source | Admitting: Physical Therapy

## 2015-01-27 ENCOUNTER — Encounter: Payer: Self-pay | Admitting: Physical Therapy

## 2015-01-27 DIAGNOSIS — M6289 Other specified disorders of muscle: Secondary | ICD-10-CM

## 2015-01-27 DIAGNOSIS — N8184 Pelvic muscle wasting: Secondary | ICD-10-CM | POA: Diagnosis not present

## 2015-01-27 NOTE — Therapy (Signed)
[facility] [address], [address] [place], [place], [address] Phone: [phone]   Fax:  [phone]  Physical Therapy Treatment  Patient Details  Name: Mary [name] MRN: [id number] Date of Birth: 11/24/1970 Referring Provider: Dr. Mechele Claude  Encounter Date: 01/27/2015      PT End of Session - 01/27/15 0805    Visit Number 11   Date for PT Re-Evaluation 03/05/15   PT Start Time 0800   PT Stop Time 0840   PT Time Calculation (min) 40 min   Activity Tolerance Patient tolerated treatment well   Behavior During Therapy [facility] for tasks assessed/performed      Past Medical History  Diagnosis Date  . Migraines     Past Surgical History  Procedure Laterality Date  . Abdominal hysterectomy    . Hernia repair    . Colonoscopy N/A 07/11/2012    Procedure: COLONOSCOPY;  Surgeon: Barrie Folk, MD;  Location: [facility];  Service: Endoscopy;  Laterality: N/A;    There were no vitals filed for this visit.  Visit Diagnosis:  Pelvic floor dysfunction      Subjective Assessment - 01/27/15 0806    Subjective This was a bad week and I was stressed out. I went to the bathroom 2 1/2 times this week.    How long can you sit comfortably? has to shift boody weight.    Patient Stated Goals work on the constipation, reduce the discomfort   Currently in Pain? Yes   Pain Score 4    Pain Location Abdomen   Pain Orientation Right;Left   Pain Descriptors / Indicators Discomfort   Pain Type Chronic pain   Pain Onset More than a month ago   Pain Frequency Intermittent   Aggravating Factors  constipation   Pain Relieving Factors stretch, walk   Multiple Pain Sites No                         [facility] Adult PT Treatment/Exercise - 01/27/15 0001    Manual Therapy   Manual Therapy Soft tissue mobilization;Myofascial release   Soft tissue mobilization abdominal muscles, diaphragm   Myofascial Release intestinal  area                PT Education - 01/27/15 0836    Education provided No          PT Short Term Goals - 01/19/15 0807    PT SHORT TERM GOAL #1   Title understand how to perform abdominal massage to improve constipation   Time 4   Period Weeks   Status Achieved   PT SHORT TERM GOAL #2   Title understand correct toileting technique to have a bowel movement   Time 4   Period Weeks   Status Achieved   PT SHORT TERM GOAL #3   Title understand how to perform perineal massage to relax pelvic floor   Time 4   Period Weeks   Status Achieved   PT SHORT TERM GOAL #4   Title understand how to delay urge to void 30 extra minutes   Time 4   Period Weeks   Status Achieved           PT Long Term Goals - 01/27/15 0234    PT LONG TERM GOAL #1   Title independent with HEP and understand how to progress herself   Time 12   Period Weeks   Status On-going  PT LONG TERM GOAL #2   Title ability to have a bowel movement every 2-3 days due to ability to relax pelvic floor to 3-4 uv when bearing down   Time 12   Period Weeks   Status Achieved   PT LONG TERM GOAL #3   Title lower abdominal pain decreased >/= 75% due to improved bowel habits   Time 12   Period Weeks   Status On-going  increased pain this week   PT LONG TERM GOAL #4   Title ability to urinate every 2 hours due to improved pelvic floor control   Time 12   Period Weeks   Status Achieved   PT LONG TERM GOAL #5   Title understand ways to to protect her abdominal with correct body mechanics for after surgery   Time 6   Period Weeks   Status New   PT LONG TERM GOAL #6   Title understand ways to prepare for abdominal surgery with conserving energy.    Time 6   Period Weeks   Status New               Plan - 01/27/15 0837    Clinical Impression Statement Patient is a 43 year old female with daignosis of constipation to outlet dysfunction and pelvic floor dysfunction.  Patient abdominal was very  tight and hard.  After the soft tissue, abdomen was softer by 75%. Patient has not met goals due to being stressed and causing abdominal discomfort. Patient would benefit from physical therapy to reduce pain    Pt will benefit from skilled therapeutic intervention in order to improve on the following deficits Decreased coordination;Increased fascial restricitons;Increased muscle spasms;Decreased endurance;Decreased activity tolerance;Pain;Impaired flexibility;Decreased mobility;Decreased strength   Clinical Impairments Affecting Rehab Potential None   PT Frequency 1x / week   PT Duration 6 weeks   PT Treatment/Interventions Biofeedback;ADLs/Self Care Home Management;Electrical Stimulation;Moist Heat;Therapeutic exercise;Therapeutic activities;Ultrasound;Neuromuscular re-education;Patient/family education;Manual techniques   PT Next Visit Plan possible EMG, abdominal exercise, soft tissue work,    PT Home Exercise Plan educate on conserving energy after surgery and body mechanincs   Consulted and Agree with Plan of Care Patient        Problem List Patient Active Problem List   Diagnosis Date Noted  . Abdominal pain, acute 05/23/2012  . Rectal bleeding 05/23/2012  . Nausea and vomiting 05/23/2012  . Dehydration 05/23/2012    Earlie Counts, PT 01/27/2015 8:40 AM  [facility] [address], [place], [place], [address] Phone: [phone]   Fax:  [phone]  Name: Mary [name] MRN: [id number] Date of Birth: January 20, 1971

## 2015-02-03 ENCOUNTER — Encounter: Payer: Self-pay | Admitting: Physical Therapy

## 2015-02-03 ENCOUNTER — Ambulatory Visit: Payer: No Typology Code available for payment source | Admitting: Physical Therapy

## 2015-02-03 DIAGNOSIS — N8184 Pelvic muscle wasting: Secondary | ICD-10-CM | POA: Diagnosis not present

## 2015-02-03 DIAGNOSIS — M6289 Other specified disorders of muscle: Secondary | ICD-10-CM

## 2015-02-03 NOTE — Patient Instructions (Signed)
  Tips for Energy Conservation for Activities of Daily Living . Plan ahead to avoid rushing. . Sit down to bathe and dry off. Wear a terry robe instead of drying off. . Use a shower/bath organizer to decrease leaning and reaching. . Use extension handles on sponges and brushes. . Install grab rails in the bathroom or use an elevated toilet seat. . Lay out clothes and toiletries before dressing. . Minimize leaning over to put on clothes and shoes. Bring your foot to your knee to apply socks and shoes. . Wear comfortable shoes and low-heeled, slip on shoes. Wear button front shirts rather than pullovers. Housekeeping . Schedule household tasks throughout the week. . Do housework sitting down when possible. . Delegate heavy housework, shopping, laundry and child care when possible. . Drag or slide objects rather than lifting. . Sit when ironing and take rest periods. . Stop working before becoming overly tired. Shopping . Organize list by aisle. . Use a grocery cart for support. . Shop at less busy times. . Ask for help with getting to the car. Meal Preparation . Use convenience and easy-to-prepare foods. . Use small appliances that take less effort to use. . Prepare meals sitting down. . Soak dishes instead of scrubbing and let dishes air dry. . Prepare double portions and freeze half. Child Care . Plan activities that can be done sitting down, such as drawing pictures, playing games, reading, and computer games. . Encourage children to climb up onto your lap or into the highchair instead of being lifted. . Make a game of the household chores so that children will want to help. . Delegate child care when possible.  [place], PT, [facility]; [address], [address] [place], [place] [address] 

## 2015-02-03 NOTE — Therapy (Addendum)
[facility] [address] [place], [place], [address] Phone: [phone]   Fax:  [phone]  Physical Therapy Treatment  Patient Details  Name: Mary [name] MRN: [id number] Date of Birth: [birth year] Referring Provider: Dr. Marcelyn Ditty  Encounter Date: 02/03/2015      PT End of Session - 02/03/15 0810    Visit Number 12   Date for PT Re-Evaluation 03/05/15   PT Start Time 0800   PT Stop Time 0840   PT Time Calculation (min) 40 min   Activity Tolerance Patient tolerated treatment well   Behavior During Therapy [facility] for tasks assessed/performed      Past Medical History  Diagnosis Date  . Migraines     Past Surgical History  Procedure Laterality Date  . Abdominal hysterectomy    . Hernia repair    . Colonoscopy N/A 07/11/2012    Procedure: COLONOSCOPY;  Surgeon: Missy Sabins, MD;  Location: [facility];  Service: Endoscopy;  Laterality: N/A;    There were no vitals filed for this visit.  Visit Diagnosis:  Pelvic floor dysfunction      Subjective Assessment - 02/03/15 0807    Subjective I see the doctor tomorrow to discuss surgery. I go to the bathroom 3 times per week. Pain is 60% better.    How long can you sit comfortably? has to shift boody weight.    Patient Stated Goals work on the constipation, reduce the discomfort   Currently in Pain? 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 Marcelyn Ditty   Onset Date/Surgical Date 02/15/12   Prior Therapy None   Precautions   Precautions None   Balance Screen   Has the patient fallen in the past 6 months No   Has the patient had a decrease in activity level because of a fear of falling?  No   Is the patient reluctant to leave their home because of a fear of falling?  No   Prior Function   Level of Independence Independent   Vocation Full time employment   Vocation Requirements sitting   Cognition   Overall Cognitive Status Within Functional Limits for tasks assessed   Observation/Other Assessments   Focus on Therapeutic Outcomes (FOTO)  45% limitaiton for Bowel CNST survey   AROM   Overall AROM Comments Lumbar ROM is full   Strength   Right Hip Flexion 5/5   Left Hip Flexion 5/5   Palpation   SI assessment  correct alignment   Palpation comment palpable tenderness located in abdomen on the lower area,                      [facility] Adult PT Treatment/Exercise - 02/03/15 0001    Self-Care   Self-Care Other Self-Care Comments   Other Self-Care Comments  tips to conserve energy after surgery   Therapeutic Activites    Therapeutic Activities ADL's   ADL's rolling in bed, sit to  stand, using a shower chair after surgery ,    Manual Therapy   Manual Therapy Myofascial release;Soft tissue mobilization   Soft tissue mobilization abdominal muscles, diaphragm   Myofascial Release lower abdomen and other hand on sacrum releasing th earea.                 PT Education - 02/03/15 0831    Education provided Yes   Education Details tips to conserve area   Person(s) Educated Patient   Methods Explanation;Handout   Comprehension Verbalized understanding          PT Short Term Goals - 01/19/15 0807    PT SHORT TERM GOAL #1   Title understand how to perform abdominal massage to improve constipation   Time 4   Period Weeks   Status Achieved   PT SHORT TERM GOAL #2   Title understand correct toileting technique to have a bowel movement   Time 4   Period Weeks   Status  Achieved   PT SHORT TERM GOAL #3   Title understand how to perform perineal massage to relax pelvic floor   Time 4   Period Weeks   Status Achieved   PT SHORT TERM GOAL #4   Title understand how to delay urge to void 30 extra minutes   Time 4   Period Weeks   Status Achieved           PT Long Term Goals - 02/03/15 7167    PT LONG TERM GOAL #1   Title independent with HEP and understand how to progress herself   Period Weeks   Status Achieved   PT LONG TERM GOAL #2   Title ability to have a bowel movement every 2-3 days due to ability to relax pelvic floor to 3-4 uv when bearing down   Time 12   Period Weeks   Status Achieved   PT LONG TERM GOAL #3   Title lower abdominal pain decreased >/= 75% due to improved bowel habits   Time 12   Period Weeks   Status On-going  improved by 60%   PT LONG TERM GOAL #4   Title ability to urinate every 2 hours due to improved pelvic floor control   Time 12   Period Weeks   Status Achieved   PT LONG TERM GOAL #5   Title understand ways to to protect her abdominal with correct body mechanics for after surgery   Time 6   Period Weeks   Status Achieved   PT LONG TERM GOAL #6   Title understand ways to prepare for abdominal surgery with conserving energy.    Time 6   Period Weeks   Status Achieved               Plan - 02/03/15 0839    Clinical Impression Statement Patient is a 44 year old female with diagnosis of constipation to outlet and pelvic floor dysfunction. Patient is having bowel movement everyother day. Patient reports lower abdominal pain decreased by 60%.  Patient is able to relax her pelvic floor to have a bowel movment.  Patient is performing self pelvic lfoor massage to relax the muscles.  Patient will  benefit from therapy to further  relax pelvic floor.    Pt will benefit from skilled therapeutic intervention in order to improve on the following deficits Decreased coordination;Increased fascial  restricitons;Increased muscle spasms;Decreased endurance;Decreased activity tolerance;Pain;Impaired flexibility;Decreased mobility;Decreased strength   Rehab Potential Good   Clinical Impairments  Affecting Rehab Potential None   PT Frequency 1x / week   PT Duration 6 weeks   PT Treatment/Interventions Biofeedback;ADLs/Self Care Home Management;Electrical Stimulation;Moist Heat;Therapeutic exercise;Therapeutic activities;Ultrasound;Neuromuscular re-education;Patient/family education;Manual techniques   PT Next Visit Plan see what MD says   PT Home Exercise Plan progress as needed   Consulted and Agree with Plan of Care Patient        Problem List Patient Active Problem List   Diagnosis Date Noted  . Abdominal pain, acute 05/23/2012  . Rectal bleeding 05/23/2012  . Nausea and vomiting 05/23/2012  . Dehydration 05/23/2012    Earlie Counts, PT 02/03/2015 8:44 AM   [facility] [address] [place], [place], [address] Phone: [phone]   Fax:  [phone]  Name: KAYANNA MCKILLOP MRN: [id number] Date of Birth: December 15, 1970    PHYSICAL THERAPY DISCHARGE SUMMARY  Visits from Start of Care: 12  Current functional level related to goals / functional outcomes: See above. LTG#1 was not met due to pain decreased by 60%.   Remaining deficits: See above.  Patient called on 02/18/2015 to cancel all of her appointments and be discharged.  Patient insurance has changed and she will want therapy after her surgery but no date is scheduled.   Education / Equipment: HEP  Plan: Patient agrees to discharge.  Patient goals were not met. Patient is being discharged due to the patient's request.  Thank you for the referral. Earlie Counts, PT 02/18/2015 3:43 PM  ?????

## 2015-02-18 ENCOUNTER — Ambulatory Visit: Payer: No Typology Code available for payment source | Admitting: Physical Therapy

## 2015-02-24 ENCOUNTER — Encounter: Payer: Self-pay | Admitting: Physical Therapy

## 2015-06-27 ENCOUNTER — Encounter (HOSPITAL_COMMUNITY): Payer: Self-pay | Admitting: Emergency Medicine

## 2015-06-27 ENCOUNTER — Emergency Department (HOSPITAL_COMMUNITY)
Admission: EM | Admit: 2015-06-27 | Discharge: 2015-06-27 | Disposition: A | Discharge status: Home / Self Care | Payer: No Typology Code available for payment source | Attending: Emergency Medicine | Admitting: Emergency Medicine

## 2015-06-27 DIAGNOSIS — Y92511 Restaurant or cafe as the place of occurrence of the external cause: Secondary | ICD-10-CM | POA: Insufficient documentation

## 2015-06-27 DIAGNOSIS — M25532 Pain in left wrist: Secondary | ICD-10-CM

## 2015-06-27 DIAGNOSIS — W010XXA Fall on same level from slipping, tripping and stumbling without subsequent striking against object, initial encounter: Secondary | ICD-10-CM | POA: Insufficient documentation

## 2015-06-27 DIAGNOSIS — M25531 Pain in right wrist: Secondary | ICD-10-CM

## 2015-06-27 DIAGNOSIS — Z792 Long term (current) use of antibiotics: Secondary | ICD-10-CM | POA: Insufficient documentation

## 2015-06-27 DIAGNOSIS — M7918 Myalgia, other site: Secondary | ICD-10-CM

## 2015-06-27 DIAGNOSIS — G43909 Migraine, unspecified, not intractable, without status migrainosus: Secondary | ICD-10-CM | POA: Insufficient documentation

## 2015-06-27 DIAGNOSIS — M25562 Pain in left knee: Secondary | ICD-10-CM

## 2015-06-27 DIAGNOSIS — Y9389 Activity, other specified: Secondary | ICD-10-CM | POA: Insufficient documentation

## 2015-06-27 DIAGNOSIS — Y998 Other external cause status: Secondary | ICD-10-CM | POA: Insufficient documentation

## 2015-06-27 DIAGNOSIS — Z87891 Personal history of nicotine dependence: Secondary | ICD-10-CM | POA: Insufficient documentation

## 2015-06-27 DIAGNOSIS — S6991XA Unspecified injury of right wrist, hand and finger(s), initial encounter: Secondary | ICD-10-CM | POA: Insufficient documentation

## 2015-06-27 DIAGNOSIS — S6992XA Unspecified injury of left wrist, hand and finger(s), initial encounter: Secondary | ICD-10-CM | POA: Insufficient documentation

## 2015-06-27 DIAGNOSIS — S8992XA Unspecified injury of left lower leg, initial encounter: Secondary | ICD-10-CM | POA: Insufficient documentation

## 2015-06-27 DIAGNOSIS — Z79899 Other long term (current) drug therapy: Secondary | ICD-10-CM | POA: Insufficient documentation

## 2015-06-27 MED ORDER — IBUPROFEN 400 MG PO TABS
400.0000 mg | ORAL_TABLET | Freq: Once | ORAL | Status: AC
  Administered 2015-06-27: 400 mg via ORAL
  Filled 2015-06-27: qty 1

## 2015-06-27 MED ORDER — ACETAMINOPHEN 325 MG PO TABS
650.0000 mg | ORAL_TABLET | Freq: Once | ORAL | Status: AC
  Administered 2015-06-27: 650 mg via ORAL
  Filled 2015-06-27: qty 2

## 2015-06-27 MED ORDER — TRAMADOL HCL 50 MG PO TABS: 50.0000 mg | ORAL_TABLET | Freq: Four times a day (QID) | ORAL | Status: DC | PRN

## 2015-06-27 NOTE — Discharge Instructions (Signed)
Please read and follow all provided instructions.  Your diagnoses today include:  1. Left knee pain   2. Bilateral wrist pain   3. Musculoskeletal pain    Tests performed today include:  Vital signs. See below for your results today.   Medications prescribed:   Take as prescribed  You can use Ibuprofen 400mg  combined with Tylenol 1000mg  for pain relief every 6 hours. Do not exceed 4g of Tylenol in one 24 hour period. Use narcotics if pain uncontrolled with the aforementioned regiment.   *PRICE in the first 24-48 hours after injury: Protect (with brace, splint, sling), if given by your provider Rest Ice- Do not apply ice pack directly to your skin, place towel or similar between your skin and ice/ice pack. Apply ice for 20 min, then remove for 40 min while awake Compression- Wear brace, elastic bandage, splint as directed by your provider Elevate affected extremity above the level of your heart when not walking around for the first 24-48 hours   Home care instructions:  Follow any educational materials contained in this packet.  Follow-up instructions: Please follow-up with your primary care provider in the next 48 hours for further evaluation of symptoms and treatment   Return instructions:   Please return to the Emergency Department if you do not get better, if you get worse, or new symptoms OR  - Fever (temperature greater than 101.93F)  - Bleeding that does not stop with holding pressure to the area    -Severe pain (please note that you may be more sore the day after your accident)  - Chest Pain  - Difficulty breathing  - Severe nausea or vomiting  - Inability to tolerate food and liquids  - Passing out  - Skin becoming red around your wounds  - Change in mental status (confusion or lethargy)  - New numbness or weakness     Please return if you have any other emergent concerns.  Additional Information:  Your vital signs today were: BP 120/86 mmHg   Pulse 74    Temp(Src) 97.5 F (36.4 C) (Oral)   Resp 18   Ht 5\' 4"  (1.626 m)   Wt 93.441 kg   BMI 35.34 kg/m2   SpO2 100% If your blood pressure (BP) was elevated above 135/85 this visit, please have this repeated by your doctor within one month. ---------------

## 2015-06-27 NOTE — ED Notes (Signed)
Pt arrives from home after a fall yesterday afternoon, states rain caused her to fall. Landed on palms and knees, rolled to the side. States woke up this morning and pain was more significant. States hurts all over.

## 2015-06-27 NOTE — ED Provider Notes (Signed)
CSN: [id number]     Arrival date & time 06/27/15  4098 History   First MD Initiated Contact with Patient 06/27/15 [phone]     Chief Complaint  Patient presents with  . Fall   (Consider location/radiation/quality/duration/timing/severity/associated sxs/prior Treatment) HPI 45 y.o. female presents to the Emergency Department today complaining of bilateral wrist pain, left knee pain, and generalized body aches s/p mechanical fall yesterday afternoon. Pt states that she was at [facility] and slipped on the wet floor. Noted catching herself with her arms on the railing in front of her. No head trauma/LOC. No syncope. States pain mostly in wrist from railing and left knee after it hit the ground. Notes pain is 10/10 and all over. Feels like ache. Tried OTC ibuprofen with minimal relief. No CP/SOB/ABD pain. No N/V. No headaches. No other symptoms noted.   Past Medical History  Diagnosis Date  . Migraines    Past Surgical History  Procedure Laterality Date  . Abdominal hysterectomy    . Hernia repair    . Colonoscopy N/A 07/11/2012    Procedure: COLONOSCOPY;  Surgeon: Barrie Folk, MD;  Location: [facility];  Service: Endoscopy;  Laterality: N/A;   No family history on file. Social History  Substance Use Topics  . Smoking status: Former Games developer  . Smokeless tobacco: None  . Alcohol Use: 1.8 oz/week    3 Glasses of wine per week   OB History    No data available     Review of Systems  Constitutional: Negative for fever.  Respiratory: Negative for shortness of breath.   Cardiovascular: Negative for chest pain.  Musculoskeletal: Positive for myalgias and arthralgias. Negative for joint swelling.  Skin: Negative for wound.   Allergies  Aspirin  Home Medications   Prior to Admission medications   Medication Sig Start Date End Date Taking? Authorizing Provider  buPROPion (WELLBUTRIN) 75 MG tablet Take 75 mg by mouth 2 (two) times daily.   Yes Historical Provider, MD  benzonatate  (TESSALON) 100 MG capsule Take 1 capsule (100 mg total) by mouth every 8 (eight) hours. 05/22/14   Renne Crigler, PA-C  Cetirizine HCl 10 MG CAPS Take 10 mg by mouth daily.    Historical Provider, MD  CHANTIX STARTING MONTH PAK 0.5 MG X 11 & 1 MG X 42 tablet Take 1 mg by mouth 2 (two) times daily.  02/10/14   Historical Provider, MD  ciprofloxacin (CIPRO) 500 MG tablet Take 1 tablet (500 mg total) by mouth 2 (two) times daily. Patient not taking: Reported on 10/30/2014 02/12/14   Raeford Razor, MD  dicyclomine (BENTYL) 10 MG capsule Take 10 mg by mouth 4 (four) times daily -  before meals and at bedtime.    Historical Provider, MD  eletriptan (RELPAX) 20 MG tablet Take 20 mg by mouth every 2 (two) hours as needed for migraine or headache. One tablet by mouth at onset of headache. May repeat in 2 hours if headache persists or recurs.    Historical Provider, MD  eletriptan (RELPAX) 20 MG tablet Take 20 mg by mouth once as needed. For migraine.  If headache returns, may take a second dose after 2 hours.    Historical Provider, MD  fluticasone (FLONASE) 50 MCG/ACT nasal spray by Nasal route. 10/05/13   Historical Provider, MD  hyoscyamine (LEVSIN, ANASPAZ) 0.125 MG tablet Take 0.125 mg by mouth every 4 (four) hours as needed.    Historical Provider, MD  lactulose (CHRONULAC) 10 GM/15ML solution  03/25/14  Historical Provider, MD  LINZESS 290 MCG CAPS capsule Take 290 mcg by mouth daily.  01/21/14   Historical Provider, MD  methocarbamol (ROBAXIN) 500 MG tablet Take 1 tablet (500 mg total) by mouth 4 (four) times daily. 05/22/14   Renne CriglerJoshua Geiple, PA-C  metroNIDAZOLE (FLAGYL) 500 MG tablet Take 1 tablet (500 mg total) by mouth 3 (three) times daily. 02/12/14   Raeford RazorStephen Kohut, MD  naproxen (NAPROSYN) 500 MG tablet Take 1 tablet (500 mg total) by mouth 2 (two) times daily. Patient not taking: Reported on 10/30/2014 05/22/14   Renne CriglerJoshua Geiple, PA-C  olopatadine (PATANOL) 0.1 % ophthalmic solution Apply to eye.    Historical  Provider, MD  ondansetron (ZOFRAN) 4 MG tablet Take 1 tablet (4 mg total) by mouth every 6 (six) hours. 02/12/14   Raeford RazorStephen Kohut, MD  oxyCODONE-acetaminophen (PERCOCET/ROXICET) 5-325 MG per tablet Take 2 tablets by mouth every 4 (four) hours as needed for severe pain. Patient not taking: Reported on 10/30/2014 05/27/14   Catha GosselinHanna Patel-Mills, PA-C  pantoprazole (PROTONIX) 40 MG tablet Take 40 mg by mouth 2 (two) times daily.  02/04/14   Historical Provider, MD  pantoprazole (PROTONIX) 40 MG tablet Take 40 mg by mouth daily. 03/25/14 03/25/15  Historical Provider, MD  sucralfate (CARAFATE) 1 G tablet Take 1 g by mouth 4 (four) times daily.  02/04/14   Historical Provider, MD  traMADol (ULTRAM) 50 MG tablet Take 50 mg by mouth at bedtime as needed (for migraines).    Historical Provider, MD  valACYclovir (VALTREX) 1000 MG tablet Take 1,000 mg by mouth. 12/12/13   Historical Provider, MD  varenicline (CHANTIX) 1 MG tablet Take 1 mg by mouth 2 (two) times daily.    Historical Provider, MD   BP 120/86 mmHg  Pulse 74  Temp(Src) 97.5 F (36.4 C) (Oral)  Resp 18  Ht 5\' 4"  (1.626 m)  Wt 93.441 kg  BMI 35.34 kg/m2  SpO2 100% Physical Exam  Constitutional: She is oriented to person, place, and time. She appears well-developed and well-nourished.  HENT:  Head: Normocephalic and atraumatic.  Eyes: EOM are normal.  Cardiovascular: Normal rate, regular rhythm, normal heart sounds and intact distal pulses.   Pulmonary/Chest: Effort normal and breath sounds normal.  Abdominal: Soft.  Musculoskeletal: Normal range of motion.       Right wrist: Normal.       Left wrist: Normal.       Right knee: Normal.       Left knee: Normal.  Left Knee: Negative anterior/poster drawer bilaterally. Negative ballottement test. No varus or valgus laxity. No crepitus. No pain with flexion or extension. No TTP of knees or ankles.  All 4 extremities neurovascularly intact. Good cap refill <2sec. Motor/sensation intact. Pt able  to ambulate    Neurological: She is alert and oriented to person, place, and time.  Skin: Skin is warm and dry.  Psychiatric: She has a normal mood and affect. Her behavior is normal. Thought content normal.  Nursing note and vitals reviewed.  ED Course  Procedures (including critical care time) Labs Review Labs Reviewed - No data to display  Imaging Review No results found. I have personally reviewed and evaluated these images and lab results as part of my medical decision-making.   EKG Interpretation None      MDM   have reviewed the relevant previous healthcare records.  I obtained HPI from historian.  ED Course:  Assessment: Pt is a 44yF who presents with bilateral wrist pain, left  knee pain s/p fall yesterday afternoon. No LOC/head Trauma. Pt able to ambualte. On exam, pt in NAD. Nontoxic/nonseptic appearing. VSS. Afebrile. All 4 extremities neurovascularly intact. Non TTP. Normal muscle soreness s/p fall. Plan is to DC home with follow up to PCP. At time of discharge, Patient is in no acute distress. Vital Signs are stable. Patient is able to ambulate. Patient able to tolerate PO.    Disposition/Plan:  DC Home Additional Verbal discharge instructions given and discussed with patient.  Pt Instructed to f/u with PCP in the next week for evaluation and treatment of symptoms. Return precautions given Pt acknowledges and agrees with plan  Supervising Physician Raeford Razor, MD   Final diagnoses:  Left knee pain  Bilateral wrist pain  Musculoskeletal pain     Audry Pili, PA-C 06/27/15 1610  Raeford Razor, MD 06/28/15 [phone]

## 2016-01-30 ENCOUNTER — Encounter (HOSPITAL_COMMUNITY): Payer: Self-pay | Admitting: *Deleted

## 2016-01-30 ENCOUNTER — Emergency Department (HOSPITAL_COMMUNITY)
Admission: EM | Admit: 2016-01-30 | Discharge: 2016-01-30 | Disposition: A | Discharge status: Home / Self Care | Payer: No Typology Code available for payment source | Attending: Emergency Medicine | Admitting: Emergency Medicine

## 2016-01-30 DIAGNOSIS — M25532 Pain in left wrist: Secondary | ICD-10-CM | POA: Insufficient documentation

## 2016-01-30 DIAGNOSIS — M25531 Pain in right wrist: Secondary | ICD-10-CM | POA: Insufficient documentation

## 2016-01-30 DIAGNOSIS — M791 Myalgia: Secondary | ICD-10-CM | POA: Insufficient documentation

## 2016-01-30 DIAGNOSIS — Z87891 Personal history of nicotine dependence: Secondary | ICD-10-CM | POA: Insufficient documentation

## 2016-01-30 DIAGNOSIS — M7918 Myalgia, other site: Secondary | ICD-10-CM

## 2016-01-30 LAB — COMPREHENSIVE METABOLIC PANEL
ALBUMIN: 4.2 g/dL (ref 3.5–5.0)
ALK PHOS: 83 U/L (ref 38–126)
ALT: 75 U/L — AB (ref 14–54)
ANION GAP: 11 (ref 5–15)
AST: 74 U/L — AB (ref 15–41)
BILIRUBIN TOTAL: 0.5 mg/dL (ref 0.3–1.2)
BUN: 6 mg/dL (ref 6–20)
CALCIUM: 9.5 mg/dL (ref 8.9–10.3)
CO2: 23 mmol/L (ref 22–32)
CREATININE: 0.96 mg/dL (ref 0.44–1.00)
Chloride: 109 mmol/L (ref 101–111)
GFR calc Af Amer: >60 mL/min (ref >60)
GFR calc non Af Amer: >60 mL/min (ref >60)
Glucose, Bld: 115 mg/dL — ABNORMAL HIGH (ref 65–99)
Potassium: 4.2 mmol/L (ref 3.5–5.1)
Sodium: 143 mmol/L (ref 135–145)
TOTAL PROTEIN: 7.5 g/dL (ref 6.5–8.1)

## 2016-01-30 LAB — CBC
HCT: 42.1 % (ref 36.0–46.0)
HEMOGLOBIN: 14.3 g/dL (ref 12.0–15.0)
MCH: 32.6 pg (ref 26.0–34.0)
MCHC: 34 g/dL (ref 30.0–36.0)
MCV: 96.1 fL (ref 78.0–100.0)
Platelets: 299 10*3/uL (ref 150–400)
RBC: 4.38 MIL/uL (ref 3.87–5.11)
RDW: 14.4 % (ref 11.5–15.5)
WBC: 8.6 10*3/uL (ref 4.0–10.5)

## 2016-01-30 MED ORDER — METHOCARBAMOL 500 MG PO TABS: 500.0000 mg | ORAL_TABLET | Freq: Two times a day (BID) | ORAL | 0 refills | Status: DC

## 2016-01-30 NOTE — ED Triage Notes (Signed)
The pt  Mary SeatFell in may face forward onto both her arms since June when she saw her regular doctor she was offered a referral to another doctor  The pt reports that she cannot afford.   For 4 months she has had bi-lateral arm pain and weakness aching sensation and cannot pick-up objucts intermittently  She just wants to be checked  lmp none

## 2016-01-30 NOTE — ED Provider Notes (Signed)
[facility]-EMERGENCY DEPT Provider Note   CSN: [id number] Arrival date & time: 01/30/16  1743     History   Chief Complaint Chief Complaint  Patient presents with  . Extremity Weakness    HPI Mary [name] is a 45 y.o. female.  HPI  45 y.o. female, presents to the Emergency Department today complaining of BUE pain since June. Notes weakness with difficulty picking up objects intermittently. Notes improvement of symptoms after heat and decrease usage. Pt states she types on a computer and feels pain worsen. Notes pain is bilateral wrists where she fell x several months ago. States she was wearing pearls on those wrists at the time. Numbness/tingling intermittently. No decrease ROM. No fevers. OTC remedies with moderate relief. PCP has seen and evaluated with unremarkable work ups. No other symptoms noted.    Past Medical History:  Diagnosis Date  . Migraines     Patient Active Problem List   Diagnosis Date Noted  . Abdominal pain, acute 05/23/2012  . Rectal bleeding 05/23/2012  . Nausea and vomiting 05/23/2012  . Dehydration 05/23/2012    Past Surgical History:  Procedure Laterality Date  . ABDOMINAL HYSTERECTOMY    . COLONOSCOPY N/A 07/11/2012   Procedure: COLONOSCOPY;  Surgeon: Barrie Folk, MD;  Location: [facility];  Service: Endoscopy;  Laterality: N/A;  . HERNIA REPAIR      OB History    No data available       Home Medications    Prior to Admission medications   Medication Sig Start Date End Date Taking? Authorizing Provider  benzonatate (TESSALON) 100 MG capsule Take 1 capsule (100 mg total) by mouth every 8 (eight) hours. 05/22/14   Renne Crigler, PA-C  buPROPion (WELLBUTRIN) 75 MG tablet Take 75 mg by mouth 2 (two) times daily.    Historical Provider, MD  Cetirizine HCl 10 MG CAPS Take 10 mg by mouth daily.    Historical Provider, MD  CHANTIX STARTING MONTH PAK 0.5 MG X 11 & 1 MG X 42 tablet Take 1 mg by mouth 2 (two) times daily.  02/10/14   Historical  Provider, MD  ciprofloxacin (CIPRO) 500 MG tablet Take 1 tablet (500 mg total) by mouth 2 (two) times daily. Patient not taking: Reported on 10/30/2014 02/12/14   Raeford Razor, MD  dicyclomine (BENTYL) 10 MG capsule Take 10 mg by mouth 4 (four) times daily -  before meals and at bedtime.    Historical Provider, MD  eletriptan (RELPAX) 20 MG tablet Take 20 mg by mouth every 2 (two) hours as needed for migraine or headache. One tablet by mouth at onset of headache. May repeat in 2 hours if headache persists or recurs.    Historical Provider, MD  eletriptan (RELPAX) 20 MG tablet Take 20 mg by mouth once as needed. For migraine.  If headache returns, may take a second dose after 2 hours.    Historical Provider, MD  fluticasone (FLONASE) 50 MCG/ACT nasal spray by Nasal route. 10/05/13   Historical Provider, MD  hyoscyamine (LEVSIN, ANASPAZ) 0.125 MG tablet Take 0.125 mg by mouth every 4 (four) hours as needed.    Historical Provider, MD  lactulose (CHRONULAC) 10 GM/15ML solution  03/25/14   Historical Provider, MD  LINZESS 290 MCG CAPS capsule Take 290 mcg by mouth daily.  01/21/14   Historical Provider, MD  methocarbamol (ROBAXIN) 500 MG tablet Take 1 tablet (500 mg total) by mouth 4 (four) times daily. 05/22/14   Renne Crigler, PA-C  metroNIDAZOLE (  FLAGYL) 500 MG tablet Take 1 tablet (500 mg total) by mouth 3 (three) times daily. 02/12/14   Raeford RazorStephen Kohut, MD  naproxen (NAPROSYN) 500 MG tablet Take 1 tablet (500 mg total) by mouth 2 (two) times daily. Patient not taking: Reported on 10/30/2014 05/22/14   Renne CriglerJoshua Geiple, PA-C  olopatadine (PATANOL) 0.1 % ophthalmic solution Apply to eye.    Historical Provider, MD  ondansetron (ZOFRAN) 4 MG tablet Take 1 tablet (4 mg total) by mouth every 6 (six) hours. 02/12/14   Raeford RazorStephen Kohut, MD  oxyCODONE-acetaminophen (PERCOCET/ROXICET) 5-325 MG per tablet Take 2 tablets by mouth every 4 (four) hours as needed for severe pain. Patient not taking: Reported on 10/30/2014 05/27/14    Catha GosselinHanna Patel-Mills, PA-C  pantoprazole (PROTONIX) 40 MG tablet Take 40 mg by mouth 2 (two) times daily.  02/04/14   Historical Provider, MD  pantoprazole (PROTONIX) 40 MG tablet Take 40 mg by mouth daily. 03/25/14 03/25/15  Historical Provider, MD  sucralfate (CARAFATE) 1 G tablet Take 1 g by mouth 4 (four) times daily.  02/04/14   Historical Provider, MD  traMADol (ULTRAM) 50 MG tablet Take 1 tablet (50 mg total) by mouth every 6 (six) hours as needed. 06/27/15   Audry Piliyler Yoav Okane, PA-C  valACYclovir (VALTREX) 1000 MG tablet Take 1,000 mg by mouth. 12/12/13   Historical Provider, MD  varenicline (CHANTIX) 1 MG tablet Take 1 mg by mouth 2 (two) times daily.    Historical Provider, MD    Family History No family history on file.  Social History Social History  Substance Use Topics  . Smoking status: Former Games developermoker  . Smokeless tobacco: Never Used  . Alcohol use 1.8 oz/week    3 Glasses of wine per week     Allergies   Aspirin   Review of Systems Review of Systems ROS reviewed and all are negative for acute change except as noted in the HPI.  Physical Exam Updated Vital Signs BP 130/80 (BP Location: Left Arm)   Pulse 110   Temp 98.2 F (36.8 C) (Oral)   Resp 20   SpO2 100%   Physical Exam  Constitutional: She is oriented to person, place, and time. Vital signs are normal. She appears well-developed and well-nourished.  HENT:  Head: Normocephalic and atraumatic.  Right Ear: Hearing normal.  Left Ear: Hearing normal.  Eyes: Conjunctivae and EOM are normal. Pupils are equal, round, and reactive to light.  Neck: Normal range of motion. Neck supple.  Cardiovascular: Normal rate, regular rhythm, normal heart sounds and intact distal pulses.   Pulmonary/Chest: Effort normal and breath sounds normal.  Musculoskeletal:  BUE wrists NVI. ROM intact. Grips strengths equal bilaterally. No erythema. No swelling.  Neurological: She is alert and oriented to person, place, and time.  Skin: Skin  is warm and dry.  Psychiatric: She has a normal mood and affect. Her speech is normal and behavior is normal. Thought content normal.  Nursing note and vitals reviewed.  ED Treatments / Results  Labs (all labs ordered are listed, but only abnormal results are displayed) Labs Reviewed  COMPREHENSIVE METABOLIC PANEL - Abnormal; Notable for the following:       Result Value   Glucose, Bld 115 (*)    AST 74 (*)    ALT 75 (*)    All other components within normal limits  CBC  URINALYSIS, ROUTINE W REFLEX MICROSCOPIC    EKG  EKG Interpretation None       Radiology No results found.  Procedures Procedures (including critical care time)  Medications Ordered in ED Medications - No data to display   Initial Impression / Assessment and Plan / ED Course  I have reviewed the triage vital signs and the nursing notes.  Pertinent labs & imaging results that were available during my care of the patient were reviewed by me and considered in my medical decision making (see chart for details).  Clinical Course    Final Clinical Impressions(s) / ED Diagnoses     {I have reviewed the relevant previous healthcare records.  {I obtained HPI from historian.   ED Course:  Assessment: Pt is a 45yF who presents with bilateral wrist pain since fall several months ago. I personally evaluated this patient at that time. Since fall, pt has felt numbness intermittently that goes away with treatment. Pt concerned due to continued symptoms. PCP work up unremarkable. On exam, pt in NAD. Nontoxic/nonseptic appearing. VSS. Afebrile. Labs unremarkable. Imaging not indicated at this time. Possible neuropathy due to fall in past. No acute fracture suspected. Plan is to DC home with follow up to Orthopedics. Will give trial of muscle relaxants to see if this helps forearm soreness after typing.. At time of discharge, Patient is in no acute distress. Vital Signs are stable. Patient is able to ambulate. Patient  able to tolerate PO.   Disposition/Plan:  DC Home Additional Verbal discharge instructions given and discussed with patient.  Pt Instructed to f/u with Ortho in the next week for evaluation and treatment of symptoms. Return precautions given Pt acknowledges and agrees with plan  Supervising Physician Gwyneth SproutWhitney Plunkett, MD  Final diagnoses:  Bilateral wrist pain  Musculoskeletal pain    New Prescriptions New Prescriptions   No medications on file     Audry Piliyler Venna Berberich, PA-C 01/30/16 2109    Gwyneth SproutWhitney Plunkett, MD 01/31/16 2033

## 2016-01-30 NOTE — ED Notes (Signed)
ED Provider at bedside. 

## 2016-01-30 NOTE — Discharge Instructions (Signed)
Please read and follow all provided instructions.  Your diagnoses today include:  1. Bilateral wrist pain   2. Musculoskeletal pain     Tests performed today include: Vital signs. See below for your results today.   Medications prescribed:  Take as prescribed   Home care instructions:  Follow any educational materials contained in this packet.  Follow-up instructions: Please follow-up with orthopedics for further evaluation of symptoms and treatment   Return instructions:  Please return to the Emergency Department if you do not get better, if you get worse, or new symptoms OR  - Fever (temperature greater than 101.14F)  - Bleeding that does not stop with holding pressure to the area    -Severe pain (please note that you may be more sore the day after your accident)  - Chest Pain  - Difficulty breathing  - Severe nausea or vomiting  - Inability to tolerate food and liquids  - Passing out  - Skin becoming red around your wounds  - Change in mental status (confusion or lethargy)  - New numbness or weakness    Please return if you have any other emergent concerns.  Additional Information:  Your vital signs today were: BP 123/90    Pulse 93    Temp 98.2 F (36.8 C) (Oral)    Resp 20    SpO2 100%  If your blood pressure (BP) was elevated above 135/85 this visit, please have this repeated by your doctor within one month. ---------------

## 2016-05-12 ENCOUNTER — Other Ambulatory Visit: Payer: Self-pay | Admitting: Gastroenterology

## 2016-05-12 DIAGNOSIS — R1310 Dysphagia, unspecified: Secondary | ICD-10-CM

## 2016-05-18 ENCOUNTER — Ambulatory Visit
Admission: RE | Admit: 2016-05-18 | Discharge: 2016-05-18 | Disposition: A | Discharge status: Home / Self Care | Payer: BLUE CROSS/BLUE SHIELD | Source: Ambulatory Visit | Attending: Gastroenterology | Admitting: Gastroenterology

## 2016-05-18 DIAGNOSIS — R1310 Dysphagia, unspecified: Secondary | ICD-10-CM

## 2016-10-11 ENCOUNTER — Encounter (HOSPITAL_COMMUNITY): Payer: Self-pay | Admitting: Emergency Medicine

## 2016-10-11 ENCOUNTER — Ambulatory Visit (HOSPITAL_COMMUNITY)
Admission: EM | Admit: 2016-10-11 | Discharge: 2016-10-11 | Disposition: A | Discharge status: Home / Self Care | Payer: BLUE CROSS/BLUE SHIELD | Attending: Emergency Medicine | Admitting: Emergency Medicine

## 2016-10-11 DIAGNOSIS — G43009 Migraine without aura, not intractable, without status migrainosus: Secondary | ICD-10-CM

## 2016-10-11 MED ORDER — KETOROLAC TROMETHAMINE 30 MG/ML IJ SOLN
INTRAMUSCULAR | Status: AC
  Filled 2016-10-11: qty 1

## 2016-10-11 MED ORDER — DEXAMETHASONE SODIUM PHOSPHATE 10 MG/ML IJ SOLN
10.0000 mg | Freq: Once | INTRAMUSCULAR | Status: AC
  Administered 2016-10-11: 10 mg via INTRAMUSCULAR

## 2016-10-11 MED ORDER — ONDANSETRON 4 MG PO TBDP
8.0000 mg | ORAL_TABLET | Freq: Once | ORAL | Status: AC
  Administered 2016-10-11: 8 mg via ORAL

## 2016-10-11 MED ORDER — ONDANSETRON 4 MG PO TBDP
ORAL_TABLET | ORAL | Status: AC
  Filled 2016-10-11: qty 2

## 2016-10-11 MED ORDER — KETOROLAC TROMETHAMINE 60 MG/2ML IM SOLN
30.0000 mg | Freq: Once | INTRAMUSCULAR | Status: AC
  Administered 2016-10-11: 30 mg via INTRAMUSCULAR

## 2016-10-11 MED ORDER — DEXAMETHASONE SODIUM PHOSPHATE 10 MG/ML IJ SOLN
INTRAMUSCULAR | Status: AC
  Filled 2016-10-11: qty 1

## 2016-10-11 MED ORDER — SUMATRIPTAN SUCCINATE 6 MG/0.5ML ~~LOC~~ SOLN
SUBCUTANEOUS | Status: AC
  Filled 2016-10-11: qty 0.5

## 2016-10-11 MED ORDER — SUMATRIPTAN SUCCINATE 6 MG/0.5ML ~~LOC~~ SOLN
6.0000 mg | Freq: Once | SUBCUTANEOUS | Status: AC
  Administered 2016-10-11: 6 mg via SUBCUTANEOUS

## 2016-10-11 MED ORDER — ONDANSETRON 4 MG PO TBDP: 4.0000 mg | ORAL_TABLET | Freq: Three times a day (TID) | ORAL | 0 refills | Status: AC | PRN

## 2016-10-11 MED ORDER — BUTALBITAL-APAP-CAFFEINE 50-325-40 MG PO TABS: 1.0000 | ORAL_TABLET | Freq: Four times a day (QID) | ORAL | 0 refills | Status: DC | PRN

## 2016-10-11 NOTE — ED Provider Notes (Signed)
  [facility]   [id number] 10/11/16 Arrival Time: 1218   SUBJECTIVE:  Mary [name] is a 46 y.o. female who presents to the urgent care with complaint of migraine headache. States she has a past history of migraines, current headache is consistent with past migraines. She has taken multiple over-the-counter pain relievers with minimal relief. Pain is centered on the right side, she does have light sensitivity and sound sensitivity also has nausea but no vomiting. No unilateral weakness, no dizziness, no blurred vision, headache is not worse with food or defecation.  ROS: As per HPI, remainder of ROS negative.   OBJECTIVE:  Vitals:   10/11/16 1315  BP: 118/76  Pulse: 90  Temp: 98.1 F (36.7 C)  TempSrc: Oral  SpO2: 98%     General appearance: alert; no distress HEENT: normocephalic; atraumatic; conjunctivae normal;  Neck: Trachea midline, no JVD noted Lungs: clear to auscultation bilaterally Heart: regular rate and rhythm Abdomen: soft, non-tender; bowel sounds normal; no masses or organomegaly; no guarding or rebound tenderness Musculoskeletal:  Skin: warm and dry Neurologic: Grossly normal.   Cranial Nerves:  II: peripheral fields grossly intact III,IV, VI: ptosis not present, extra-ocular movements intact bilaterally, direct and consensual pupillary light reflexes intact bilaterally V: facial sensation, jaw opening, and bite strength equal bilaterally VII: eyebrow raise, eyelid close, smile, frown, pucker equal bilaterally VIII: hearing grossly normal bilaterally  IX,X: palate elevation and swallowing intact XI: bilateral shoulder shrug and lateral head rotation equal and strong XII: midline tongue extension Psychological:  alert and cooperative; normal mood and affect     ASSESSMENT & PLAN:  1. Migraine without aura and without status migrainosus, not intractable     Meds ordered this encounter  Medications  . amphetamine-dextroamphetamine  (ADDERALL) 20 MG tablet    Sig: Take 20 mg by mouth daily.  Marland Kitchen ketorolac (TORADOL) injection 30 mg  . dexamethasone (DECADRON) injection 10 mg  . SUMAtriptan (IMITREX) injection 6 mg  . ondansetron (ZOFRAN-ODT) disintegrating tablet 8 mg  . butalbital-acetaminophen-caffeine (FIORICET, ESGIC) 50-325-40 MG tablet    Sig: Take 1-2 tablets by mouth every 6 (six) hours as needed for headache.    Dispense:  20 tablet    Refill:  0    Order Specific Question:   Supervising Provider    Answer:   Domenick Gong [[id number]]  . ondansetron (ZOFRAN ODT) 4 MG disintegrating tablet    Sig: Take 1 tablet (4 mg total) by mouth every 8 (eight) hours as needed for nausea or vomiting.    Dispense:  20 tablet    Refill:  0    Order Specific Question:   Supervising Provider    Answer:   Domenick Gong [[id number]]   Strict follow-up guidelines provided, recommend rest, treated with a headache cocktail in clinic, discharged home with Zofran and Fiorcet  Reviewed expectations re: course of current medical issues. Questions answered. Outlined signs and symptoms indicating need for more acute intervention. Patient verbalized understanding. After Visit Summary given.    Procedures:    Labs Reviewed - No data to display  No results found.  Allergies  Allergen Reactions  . Aspirin Hives    PMHx, SurgHx, SocialHx, Medications, and Allergies were reviewed in the Visit Navigator and updated as appropriate.       Dorena Bodo, NP 10/11/16 1432

## 2016-10-11 NOTE — ED Triage Notes (Signed)
Pt reports having a headache since last Friday.  Pt states she has been treated in the past for migraines.  Pt drove herself to the urgent care today and has no driver.

## 2016-10-11 NOTE — Discharge Instructions (Signed)
For your headache, you've been given a "cocktail" of several different medicines here in the clinic. Recommend going home resting in a quiet, dark environment, have also prescribed 2 medicines, For Nausea, I have prescribed Zofran, take 1 tablet under the tongue every 8 hours as needed. I have also prescribed a medicine for pain called Fioricet, this medicine is a narcotic, it will cause drowsiness, and it is addictive. Do not take more than what is necessary, do not drink alcohol while taking, and do not operate any heavy machinery while taking this medicine. If her headache worsens in any way, or you develop numbness, blurred vision, or weakness, go to the ER

## 2017-04-03 ENCOUNTER — Emergency Department (HOSPITAL_COMMUNITY)
Admission: EM | Admit: 2017-04-03 | Discharge: 2017-04-03 | Disposition: A | Discharge status: Home / Self Care | Payer: BLUE CROSS/BLUE SHIELD | Attending: Emergency Medicine | Admitting: Emergency Medicine

## 2017-04-03 ENCOUNTER — Emergency Department (HOSPITAL_COMMUNITY): Payer: BLUE CROSS/BLUE SHIELD

## 2017-04-03 ENCOUNTER — Other Ambulatory Visit: Payer: Self-pay

## 2017-04-03 DIAGNOSIS — Z87891 Personal history of nicotine dependence: Secondary | ICD-10-CM | POA: Diagnosis not present

## 2017-04-03 DIAGNOSIS — Z79899 Other long term (current) drug therapy: Secondary | ICD-10-CM | POA: Insufficient documentation

## 2017-04-03 DIAGNOSIS — G43901 Migraine, unspecified, not intractable, with status migrainosus: Secondary | ICD-10-CM | POA: Insufficient documentation

## 2017-04-03 DIAGNOSIS — R51 Headache: Secondary | ICD-10-CM | POA: Diagnosis present

## 2017-04-03 DIAGNOSIS — R079 Chest pain, unspecified: Secondary | ICD-10-CM | POA: Diagnosis not present

## 2017-04-03 LAB — CBC
HCT: 41.7 % (ref 36.0–46.0)
HEMOGLOBIN: 14.1 g/dL (ref 12.0–15.0)
MCH: 33.3 pg (ref 26.0–34.0)
MCHC: 33.8 g/dL (ref 30.0–36.0)
MCV: 98.3 fL (ref 78.0–100.0)
Platelets: 309 10*3/uL (ref 150–400)
RBC: 4.24 MIL/uL (ref 3.87–5.11)
RDW: 14.1 % (ref 11.5–15.5)
WBC: 7.6 10*3/uL (ref 4.0–10.5)

## 2017-04-03 LAB — I-STAT TROPONIN, ED
Troponin i, poc: 0 ng/mL (ref 0.00–0.08)
Troponin i, poc: 0 ng/mL (ref 0.00–0.08)

## 2017-04-03 LAB — BASIC METABOLIC PANEL
Anion gap: 11 (ref 5–15)
BUN: 10 mg/dL (ref 6–20)
CHLORIDE: 108 mmol/L (ref 101–111)
CO2: 21 mmol/L — AB (ref 22–32)
CREATININE: 0.81 mg/dL (ref 0.44–1.00)
Calcium: 8.9 mg/dL (ref 8.9–10.3)
GFR calc non Af Amer: >60 mL/min (ref >60)
GLUCOSE: 90 mg/dL (ref 65–99)
Potassium: 4 mmol/L (ref 3.5–5.1)
Sodium: 140 mmol/L (ref 135–145)

## 2017-04-03 LAB — TYPE AND SCREEN
ABO/RH(D): O POS
ANTIBODY SCREEN: NEGATIVE

## 2017-04-03 LAB — I-STAT BETA HCG BLOOD, ED (MC, WL, AP ONLY): I-stat hCG, quantitative: <5 m[IU]/mL (ref <5)

## 2017-04-03 MED ORDER — DIPHENHYDRAMINE HCL 50 MG/ML IJ SOLN
12.5000 mg | Freq: Once | INTRAMUSCULAR | Status: AC
  Administered 2017-04-03: 12.5 mg via INTRAVENOUS
  Filled 2017-04-03: qty 1

## 2017-04-03 MED ORDER — PROCHLORPERAZINE EDISYLATE 5 MG/ML IJ SOLN
10.0000 mg | Freq: Once | INTRAMUSCULAR | Status: AC
  Administered 2017-04-03: 10 mg via INTRAVENOUS
  Filled 2017-04-03: qty 2

## 2017-04-03 MED ORDER — MORPHINE SULFATE (PF) 4 MG/ML IV SOLN
4.0000 mg | Freq: Once | INTRAVENOUS | Status: AC
  Administered 2017-04-03: 4 mg via INTRAVENOUS
  Filled 2017-04-03: qty 1

## 2017-04-03 MED ORDER — BUTALBITAL-APAP-CAFFEINE 50-325-40 MG PO TABS: 1.0000 | ORAL_TABLET | Freq: Four times a day (QID) | ORAL | 0 refills | Status: AC | PRN

## 2017-04-03 NOTE — Discharge Instructions (Signed)
Follow-up with your primary care doctor as needed, return for fever worsening symptoms

## 2017-04-03 NOTE — ED Provider Notes (Signed)
[facility] Provider Note   CSN: [id number] Arrival date & time: 04/03/17  1058     History   Chief Complaint Chief Complaint  Patient presents with  . Chest Pain  . Headache  . GI Bleeding    HPI Mary [name] is a 47 y.o. female.  HPI Patient presents to the emergency room for evaluation of a migraine headache.  Patient states she has a history of migraine headaches.  She is tried her home medications without relief.  Patient states the pain is severe but she has had migraines like this before.  She denies any recent injuries.  No numbness or weakness.  No fevers or chills.  Patient also mentions having intermittent sharp chest pain rating to left upper back.  These episodes have been coming and going since Thursday.  She does not have any history of heart disease or pulmonary embolism.  Patient states she also noticed an episode of blood in her stool a few days ago.  She does have a history of constipation and normally has to strain.  She has not noticed any blood since. Past Medical History:  Diagnosis Date  . Migraines     Patient Active Problem List   Diagnosis Date Noted  . Abdominal pain, acute 05/23/2012  . Rectal bleeding 05/23/2012  . Nausea and vomiting 05/23/2012  . Dehydration 05/23/2012    Past Surgical History:  Procedure Laterality Date  . ABDOMINAL HYSTERECTOMY    . COLONOSCOPY N/A 07/11/2012   Procedure: COLONOSCOPY;  Surgeon: Barrie FolkJohn C Hayes, MD;  Location: [facility] ENDOSCOPY;  Service: Endoscopy;  Laterality: N/A;  . HERNIA REPAIR      OB History    No data available       Home Medications    Prior to Admission medications   Medication Sig Start Date End Date Taking? Authorizing Provider  amphetamine-dextroamphetamine (ADDERALL) 20 MG tablet Take 20 mg by mouth daily.   Yes [provider]  eletriptan (RELPAX) 20 MG tablet Take 20 mg by mouth every 4 (four) hours as needed for migraine or  headache.    Yes [provider]  linaclotide (LINZESS) 290 MCG CAPS capsule Take 290 mcg by mouth daily as needed (constipation).   Yes [provider]  butalbital-acetaminophen-caffeine (FIORICET, ESGIC) [phone] MG tablet Take 1-2 tablets by mouth every 6 (six) hours as needed for headache. 04/03/17 04/03/18  Linwood DibblesKnapp, Jana Swartzlander, MD  ondansetron (ZOFRAN ODT) 4 MG disintegrating tablet Take 1 tablet (4 mg total) by mouth every 8 (eight) hours as needed for nausea or vomiting. Patient not taking: Reported on 04/03/2017 10/11/16   Dorena BodoKennard, Lawrence, NP    Family History No family history on file.  Social History Social History   Tobacco Use  . Smoking status: Former Games developermoker  . Smokeless tobacco: Never Used  Substance Use Topics  . Alcohol use: Yes    Alcohol/week: 1.8 oz    Types: 3 Glasses of wine per week  . Drug use: No     Allergies   Aspirin   Review of Systems Review of Systems  All other systems reviewed and are negative.    Physical Exam Updated Vital Signs BP 111/87 (BP Location: Right Arm)   Pulse 78   Temp 98.7 F (37.1 C) (Oral)   Resp 14   SpO2 100%   Physical Exam  Constitutional: She appears well-developed and well-nourished. No distress.  HENT:  Head: Normocephalic and atraumatic.  Right Ear: External ear  normal.  Left Ear: External ear normal.  Eyes: Conjunctivae are normal. Right eye exhibits no discharge. Left eye exhibits no discharge. No scleral icterus.  Neck: Neck supple. No tracheal deviation present.  Cardiovascular: Normal rate, regular rhythm and intact distal pulses.  Pulmonary/Chest: Effort normal and breath sounds normal. No stridor. No respiratory distress. She has no wheezes. She has no rales.  Abdominal: Soft. Bowel sounds are normal. She exhibits no distension. There is no tenderness. There is no rebound and no guarding.  Musculoskeletal: She exhibits no edema or tenderness.  Neurological: She is alert. She has normal  strength. No cranial nerve deficit (no facial droop, extraocular movements intact, no slurred speech) or sensory deficit. She exhibits normal muscle tone. She displays no seizure activity. Coordination normal.  Skin: Skin is warm and dry. No rash noted.  Psychiatric: She has a normal mood and affect.  Nursing note and vitals reviewed.    ED Treatments / Results  Labs (all labs ordered are listed, but only abnormal results are displayed) Labs Reviewed  BASIC METABOLIC PANEL - Abnormal; Notable for the following components:      Result Value   CO2 21 (*)    All other components within normal limits  CBC  I-STAT TROPONIN, ED  I-STAT BETA HCG BLOOD, ED ([facility], [facility], [facility] ONLY)  I-STAT TROPONIN, ED  POC OCCULT BLOOD, ED  TYPE AND SCREEN    EKG  EKG Interpretation  Date/Time:  Monday April 03 2017 11:43:18 EST Ventricular Rate:  93 PR Interval:  118 QRS Duration: 74 QT Interval:  360 QTC Calculation: 447 R Axis:   17 Text Interpretation:  Normal sinus rhythm Biatrial enlargement Abnormal ECG No significant change since last tracing Confirmed by Linwood Dibbles [phone]) on 04/03/2017 4:10:40 PM       Radiology Dg Chest 2 View  Result Date: 04/03/2017 CLINICAL DATA:  Chest pain EXAM: CHEST  2 VIEW COMPARISON:  February 12, 2014 FINDINGS: Lungs are clear. The heart size and pulmonary vascularity are normal. No adenopathy. There are surgical clips at the gastroesophageal junction. No bone lesions. IMPRESSION: No edema or consolidation. Electronically Signed   By: Bretta Bang III M.D.   On: 04/03/2017 13:26    Procedures Procedures (including critical care time)  Medications Ordered in ED Medications  prochlorperazine (COMPAZINE) injection 10 mg (10 mg Intravenous Given 04/03/17 1820)  diphenhydrAMINE (BENADRYL) injection 12.5 mg (12.5 mg Intravenous Given 04/03/17 1819)  morphine 4 MG/ML injection 4 mg (4 mg Intravenous Given 04/03/17 1820)     Initial Impression / Assessment and  Plan / ED Course  I have reviewed the triage vital signs and the nursing notes.  Pertinent labs & imaging results that were available during my care of the patient were reviewed by me and considered in my medical decision making (see chart for details).  Clinical Course as of Apr 03 1941  Mon Apr 03, 2017  4782 Patient is feeling better after medications.  She is ready to go home  [JK]    Clinical Course User Index [JK] Linwood Dibbles, MD  Patient presented to the emergency room with a primary complaint of a migraine headache.  She has a history of migraines in the past.  No signs of infection.  Normal neurologic exam.  Patient symptoms improved with treatment.  Is ready for discharge.  Patient incidentally mentioned episodes of chest pain as well as some blood in her stool.  Her cardiac enzymes and EKG are reassuring.  The symptoms  are atypical and I doubt emergent etiology such as pulmonary embolism or acute coronary syndrome.  Final Clinical Impressions(s) / ED Diagnoses   Final diagnoses:  Migraine with status migrainosus, not intractable, unspecified migraine type    ED Discharge Orders        Ordered    butalbital-acetaminophen-caffeine (FIORICET, ESGIC) 50-325-40 MG tablet  Every 6 hours PRN     04/03/17 1941       Linwood Dibbles, MD 04/03/17 1943

## 2017-04-03 NOTE — ED Notes (Signed)
Pt discharged from ED; instructions provided and scripts given; Pt encouraged to return to ED if symptoms worsen and to f/u with PCP; Pt verbalized understanding of all instructions 

## 2017-04-03 NOTE — ED Notes (Signed)
Pt stated She was trying to leave.

## 2017-04-03 NOTE — ED Triage Notes (Signed)
Pt checked in for a migraine. Now states she has had centralized chest pain radiating into left upper back that comes and goes, has had this since Thursday. Also has a migraine, and also states she has seen some bright red blood in her stool, but states she strains. Denies hemorrhoid.

## 2017-05-11 IMAGING — RF DG ESOPHAGUS
5 series · 14 of 15 positions shown · non-contrast
Comparison: None.

CLINICAL DATA: Dysphagia

EXAM:
ESOPHOGRAM / BARIUM SWALLOW / BARIUM TABLET STUDY
TECHNIQUE: Combined double contrast and single contrast examination performed
using effervescent crystals, thick barium liquid, and thin barium
liquid. The patient was observed with fluoroscopy swallowing a 13 mm
barium sulphate tablet.
FLUOROSCOPY TIME:  Fluoroscopy Time:  1 minutes 0 seconds
Radiation Exposure Index (if provided by the fluoroscopic device):
141 mGy
Number of Acquired Spot Images: 0

[Series 1: sequence · 3 of 7 frames shown (1 of 4)]
[frame 2/7]
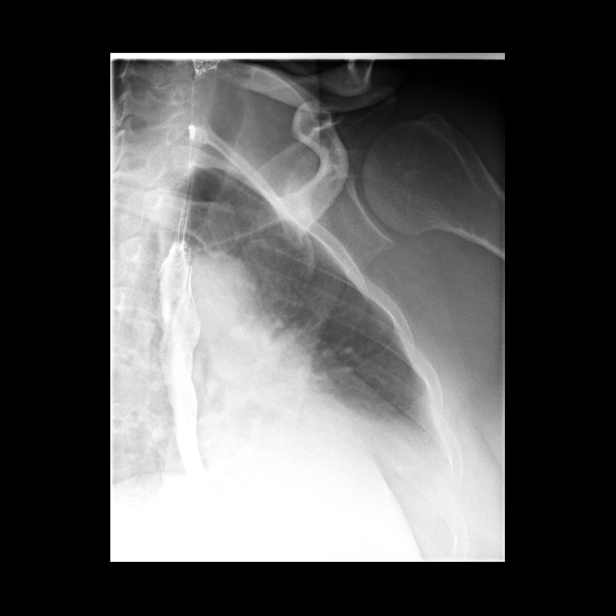
[frame 4/7]
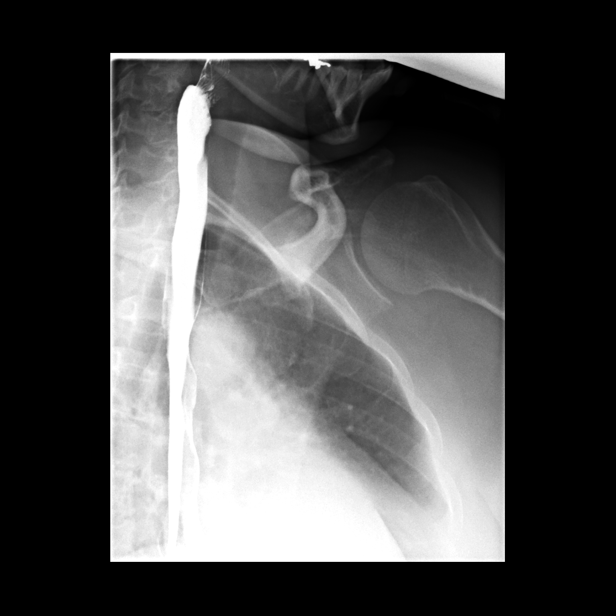
[frame 6/7]
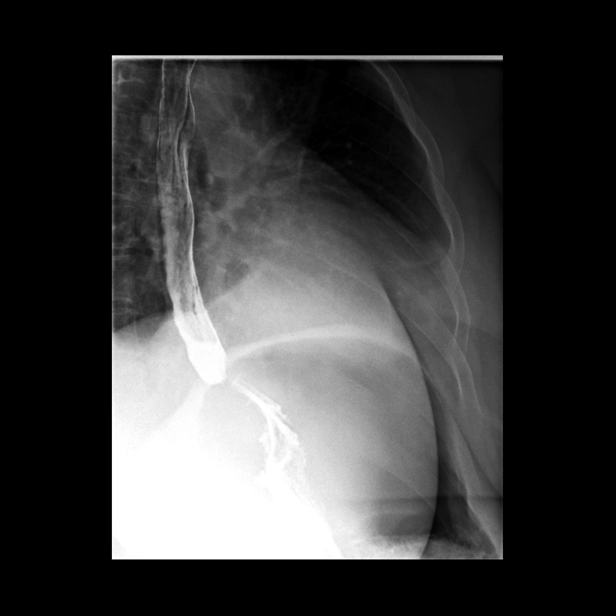

[Series 2: sequence · 3 of 23 frames shown (2 of 4)]
[frame 4/23]
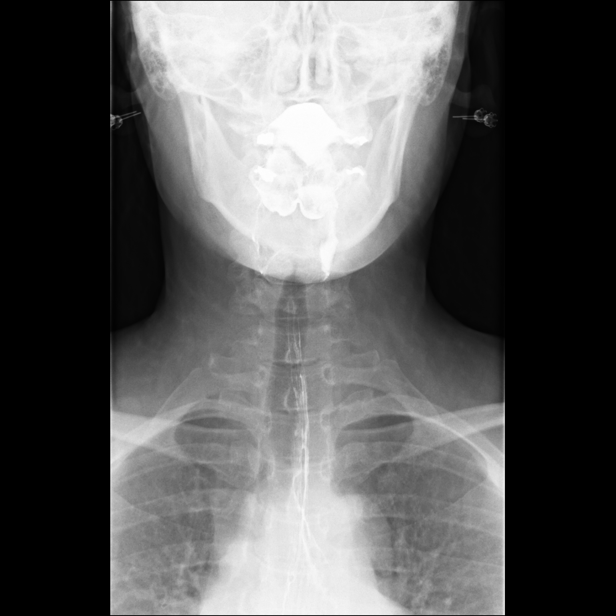
[frame 12/23]
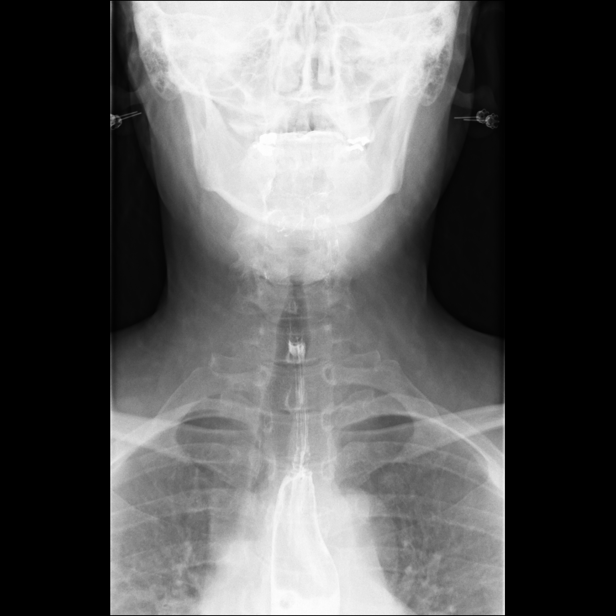
[frame 20/23]
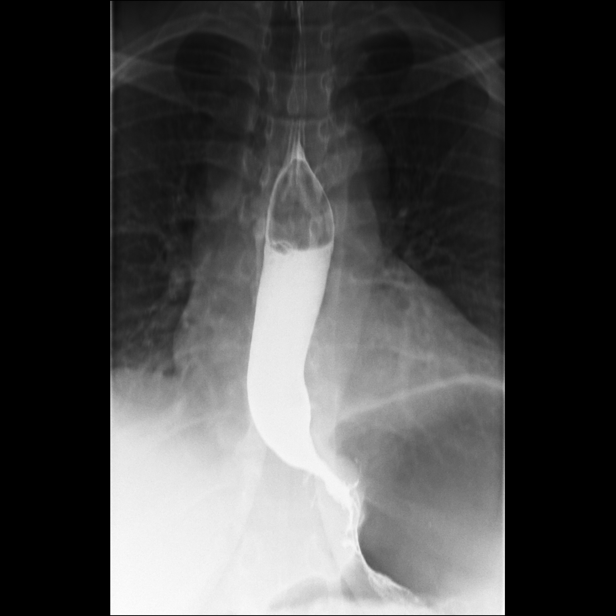

[Series 3: sequence · 3 of 12 frames shown (3 of 4)]
[frame 2/12]
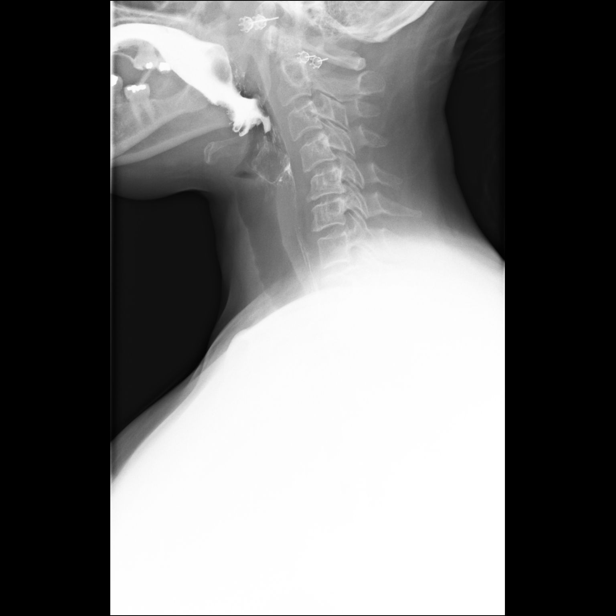
[frame 9/12]
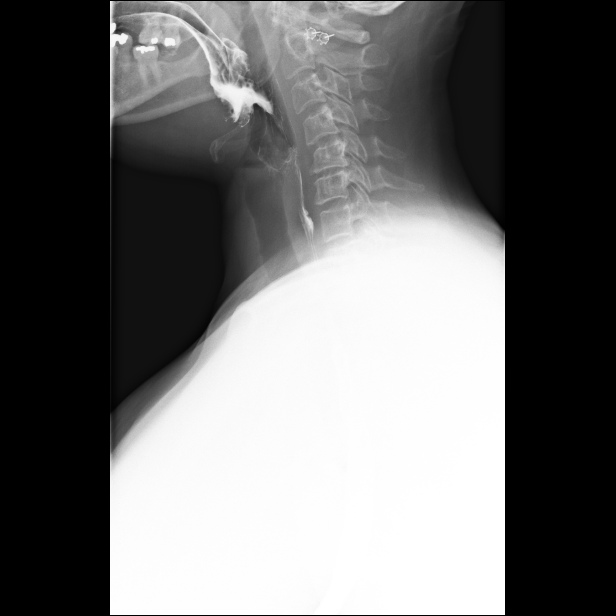
[frame 11/12]
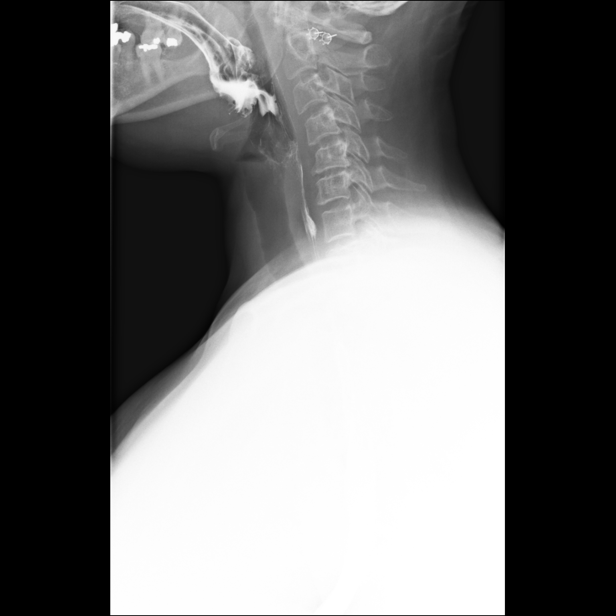

[Series 4: sequence · 4 of 98 frames shown (4 of 4)]
[frame 6/98]
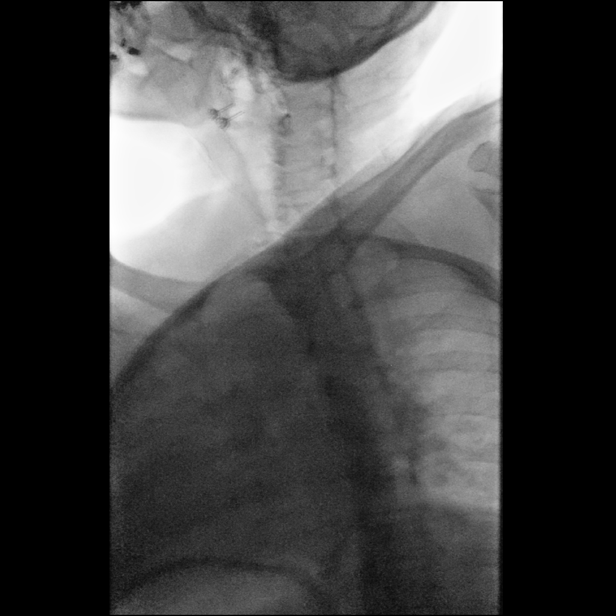
[frame 15/98]
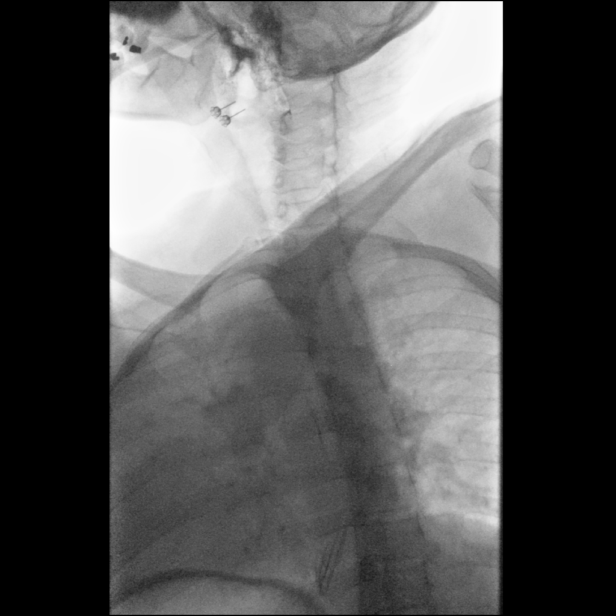
[frame 50/98]
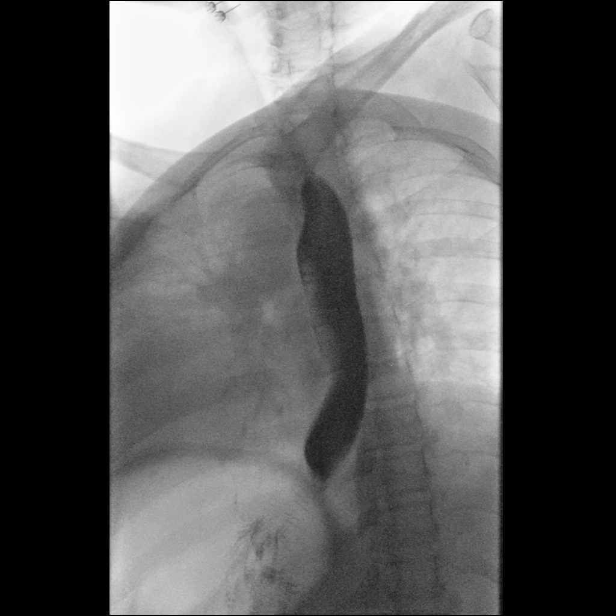
[frame 84/98]
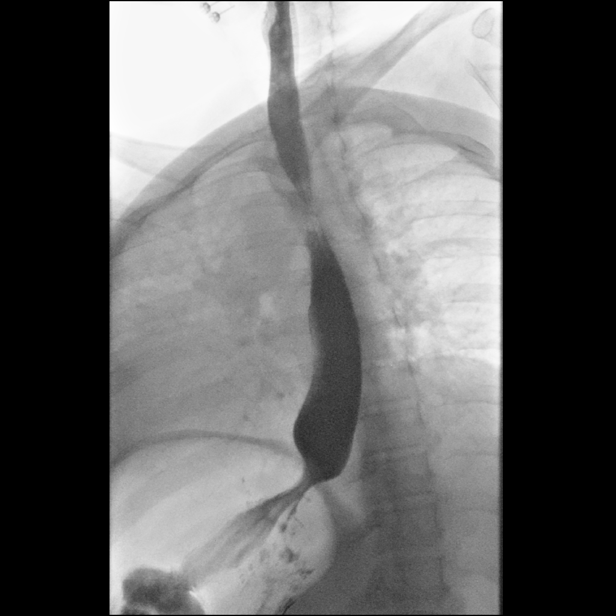

[Series 5: one shot · 1 of 1 slices shown]
[im 1/1]
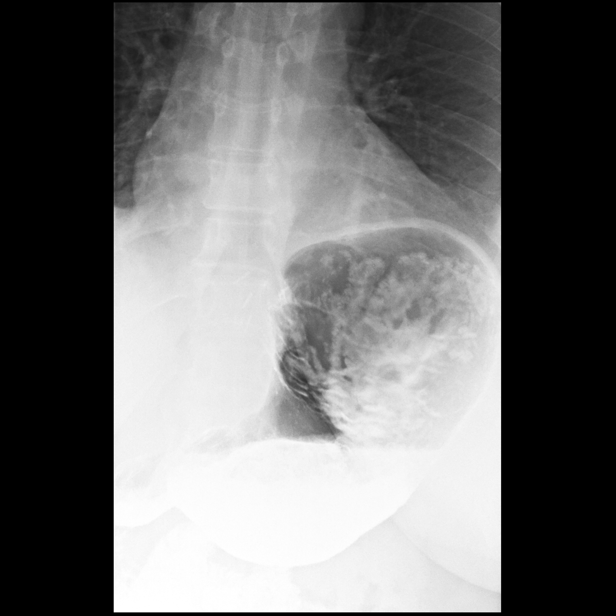

[14 of 15 positions shown; findings below may reference images not displayed]

FINDINGS: Initially a double-contrast barium swallow was performed. The mucosa
of the esophagus is unremarkable. A single contrast study shows the
swallowing mechanism to be normal. Esophageal peristalsis is normal.
No hiatal hernia is seen. No gastroesophageal reflux is
demonstrated. A barium pill was given at the end of the study which
passed into the stomach without delay.
IMPRESSION: Negative barium swallow.

## 2018-02-14 DIAGNOSIS — G459 Transient cerebral ischemic attack, unspecified: Secondary | ICD-10-CM

## 2018-02-14 HISTORY — DX: Transient cerebral ischemic attack, unspecified: G45.9

## 2018-10-08 ENCOUNTER — Ambulatory Visit: Payer: BLUE CROSS/BLUE SHIELD | Admitting: Neurology

## 2018-10-08 ENCOUNTER — Encounter

## 2018-11-20 ENCOUNTER — Other Ambulatory Visit: Payer: Self-pay | Admitting: Neurology

## 2018-11-20 DIAGNOSIS — M79609 Pain in unspecified limb: Secondary | ICD-10-CM

## 2018-11-20 DIAGNOSIS — R202 Paresthesia of skin: Secondary | ICD-10-CM

## 2018-12-01 ENCOUNTER — Ambulatory Visit
Admission: RE | Admit: 2018-12-01 | Discharge: 2018-12-01 | Disposition: A | Discharge status: Home / Self Care | Payer: BLUE CROSS/BLUE SHIELD | Source: Ambulatory Visit | Attending: Neurology | Admitting: Neurology

## 2018-12-01 ENCOUNTER — Other Ambulatory Visit: Payer: Self-pay

## 2018-12-01 DIAGNOSIS — M79609 Pain in unspecified limb: Secondary | ICD-10-CM | POA: Diagnosis present

## 2018-12-01 DIAGNOSIS — R202 Paresthesia of skin: Secondary | ICD-10-CM | POA: Insufficient documentation

## 2018-12-01 LAB — POCT I-STAT CREATININE: Creatinine, Ser: 1.1 mg/dL — ABNORMAL HIGH (ref 0.44–1.00)

## 2018-12-01 MED ORDER — GADOBUTROL 1 MMOL/ML IV SOLN
9.0000 mL | Freq: Once | INTRAVENOUS | Status: AC | PRN
  Administered 2018-12-01: 9 mL via INTRAVENOUS

## 2019-03-12 ENCOUNTER — Other Ambulatory Visit: Payer: Self-pay | Admitting: Infectious Diseases

## 2019-03-12 DIAGNOSIS — Z1231 Encounter for screening mammogram for malignant neoplasm of breast: Secondary | ICD-10-CM

## 2019-05-30 ENCOUNTER — Ambulatory Visit: Payer: BLUE CROSS/BLUE SHIELD | Attending: Internal Medicine

## 2019-05-30 DIAGNOSIS — Z23 Encounter for immunization: Secondary | ICD-10-CM

## 2019-05-30 NOTE — Progress Notes (Signed)
   Covid-19 Vaccination Clinic  Name:  VALERYA MAXTON    MRN: [id number] DOB: September 16, 1970  05/30/2019  Ms. Batterson was observed post Covid-19 immunization for 15 minutes without incident. She was provided with Vaccine Information Sheet and instruction to access the V-Safe system.   Ms. Garside was instructed to call 911 with any severe reactions post vaccine: Marland Kitchen Difficulty breathing  . Swelling of face and throat  . A fast heartbeat  . A bad rash all over body  . Dizziness and weakness   Immunizations Administered    Name Date Dose VIS Date Route   Pfizer COVID-19 Vaccine 05/30/2019  4:25 PM 0.3 mL 01/25/2019 Intramuscular   Manufacturer: [employer], [employer]   Lot: [id number]   NDC: [address]-4

## 2019-06-24 ENCOUNTER — Ambulatory Visit: Payer: BLUE CROSS/BLUE SHIELD | Attending: Internal Medicine

## 2019-07-04 ENCOUNTER — Ambulatory Visit: Payer: BLUE CROSS/BLUE SHIELD | Attending: Internal Medicine

## 2020-11-16 ENCOUNTER — Encounter: Payer: Self-pay | Admitting: Podiatry

## 2020-11-25 ENCOUNTER — Other Ambulatory Visit: Payer: Self-pay | Admitting: Podiatry

## 2020-11-26 NOTE — Discharge Instructions (Addendum)
[facility]  POST OPERATIVE INSTRUCTIONS FOR DR. Ether Griffins AND DR. Esau Fridman [facility] CLINIC PODIATRY DEPARTMENT   Take your medication as prescribed.  Pain medication should be taken only as needed.  Keep the dressing clean, dry and intact.  Keep your foot elevated above the heart level for the first 48 hours.  Continue elevation thereafter to improve swelling. You may place ice on the top of your foot/front of your ankle as needed for max 10 minutes out of every 1 hour as needed  Remain nonweightbearing at all times to the right foot.   Always wear your post-op shoe when walking.  Always use your crutches if you are to be non-weight bearing.  Do not take a shower. Baths are permissible as long as the foot is kept out of the water.   Every hour you are awake:  Bend your knee 15 times. Flex foot 15 times Massage calf 15 times  Call [facility] [phone]) if any of the following problems occur: You develop a temperature or fever. The bandage becomes saturated with blood. Medication does not stop your pain. Injury of the foot occurs. Any symptoms of infection including redness, odor, or red streaks running from wound.   Information for Discharge Teaching: EXPAREL (bupivacaine liposome injectable suspension)   Your surgeon or anesthesiologist gave you EXPAREL(bupivacaine) to help control your pain after surgery.  EXPAREL is a local anesthetic that provides pain relief by numbing the tissue around the surgical site. EXPAREL is designed to release pain medication over time and can control pain for up to 72 hours. Depending on how you respond to EXPAREL, you may require less pain medication during your recovery.  Possible side effects: Temporary loss of sensation or ability to move in the area where bupivacaine was injected. Nausea, vomiting, constipation Rarely, numbness and tingling in your mouth or lips, lightheadedness, or anxiety  may occur. Call your doctor right away if you think you may be experiencing any of these sensations, or if you have other questions regarding possible side effects.  Follow all other discharge instructions given to you by your surgeon or nurse. Eat a healthy diet and drink plenty of water or other fluids.  If you return to the hospital for any reason within 96 hours following the administration of EXPAREL, it is important for health care providers to know that you have received this anesthetic. A teal colored band has been placed on your arm with the date, time and amount of EXPAREL you have received in order to alert and inform your health care providers. Please leave this armband in place for the full 96 hours following administration, and then you may remove the band.  PERIPHERAL NERVE BLOCK PATIENT INFORMATION  Your surgeon has requested a peripheral nerve block for your surgery. This anesthetic technique provides excellent post-operative pain relief for you in a safe and effective manner. It will also help reduce the risk of nausea and vomiting and allow earlier discharge from the hospital.   The block is performed under sedation with ultrasound guidance prior to your procedure. Due to the sedation, your may or may not remember the block experience. The nerve block will begin to take effect anywhere from 5 to 30 minutes after being administered. You will be transported to the operating room from your surgery after the block is completed.   At the end of surgery, when the anesthesia wears off, you will notice a few things. Your may not be able  to move or feel the part of your body targeted by the nerve block. These are normal experiences, and they will disappear as the block wears off.  If you had an interscalene nerve block performed (which is common for shoulder surgery), your voice can be very hoarse and you may feel that you are not able to take as deep a breath as you did before surgery. Some  patients may also notice a droopy eyelid on the affected side. These symptoms will resolve once the block wears off.  Pain control: The nerve block technique used is a single injection that can last anywhere from 1-3 days. The duration of the numbness can vary between individuals. After leaving the hospital, it is important that you begin to take your prescribed pain medication when you start to sense the nerve block wearing off. This will help you avoid unpleasant pain at the time the nerve block wears off, which can sometimes be in the middle of the night. The block will only cover pain in the areas targeted by the nerve block so if you experience surgical pain outside of that area, please take your prescribed pain medication. Management of the "numb area": After a nerve block, you cannot feel pain, pressure, or temperature in the affected area so there is an increased risk for injury. You should take extra care to protect the affected areas until sensation and movement returns. Please take caution to not come in contact with extremely hot or cold items because you will not be able to sense or protect yourself form the extremes of temperature.  You may experience some persistent numbness after the procedure by most neurological deficits resolve over time and the incidence of serious long term neurological complications attributable to peripheral nerve blocks are relatively uncommon.

## 2020-12-03 ENCOUNTER — Ambulatory Visit: Payer: BLUE CROSS/BLUE SHIELD | Admitting: Anesthesiology

## 2020-12-03 ENCOUNTER — Ambulatory Visit
Admission: RE | Admit: 2020-12-03 | Discharge: 2020-12-03 | Disposition: A | Discharge status: Home / Self Care | Payer: BLUE CROSS/BLUE SHIELD | Attending: Podiatry | Admitting: Podiatry

## 2020-12-03 ENCOUNTER — Other Ambulatory Visit: Payer: Self-pay

## 2020-12-03 ENCOUNTER — Encounter
Admission: RE | Disposition: A | Discharge status: Home / Self Care | Payer: Self-pay | Source: Home / Self Care | Attending: Podiatry

## 2020-12-03 DIAGNOSIS — Z79899 Other long term (current) drug therapy: Secondary | ICD-10-CM | POA: Insufficient documentation

## 2020-12-03 DIAGNOSIS — Z91048 Other nonmedicinal substance allergy status: Secondary | ICD-10-CM | POA: Insufficient documentation

## 2020-12-03 DIAGNOSIS — Z87891 Personal history of nicotine dependence: Secondary | ICD-10-CM | POA: Diagnosis not present

## 2020-12-03 DIAGNOSIS — M25871 Other specified joint disorders, right ankle and foot: Secondary | ICD-10-CM | POA: Insufficient documentation

## 2020-12-03 DIAGNOSIS — Z09 Encounter for follow-up examination after completed treatment for conditions other than malignant neoplasm: Secondary | ICD-10-CM

## 2020-12-03 DIAGNOSIS — Z886 Allergy status to analgesic agent status: Secondary | ICD-10-CM | POA: Insufficient documentation

## 2020-12-03 DIAGNOSIS — M2011 Hallux valgus (acquired), right foot: Secondary | ICD-10-CM | POA: Insufficient documentation

## 2020-12-03 HISTORY — DX: Essential (primary) hypertension: I10

## 2020-12-03 HISTORY — PX: BUNIONECTOMY: SHX129

## 2020-12-03 SURGERY — BUNIONECTOMY
Anesthesia: Regional | Site: Foot | Laterality: Right

## 2020-12-03 MED ORDER — HYDROMORPHONE HCL 1 MG/ML IJ SOLN
0.2500 mg | INTRAMUSCULAR | Status: DC | PRN
  Administered 2020-12-03: 0.25 mg via INTRAVENOUS

## 2020-12-03 MED ORDER — OXYCODONE HCL 5 MG/5ML PO SOLN
5.0000 mg | Freq: Once | ORAL | Status: AC | PRN
Start: 2020-12-03 — End: 2020-12-03

## 2020-12-03 MED ORDER — ONDANSETRON HCL 4 MG/2ML IJ SOLN
INTRAMUSCULAR | Status: DC | PRN
  Administered 2020-12-03: 4 mg via INTRAVENOUS

## 2020-12-03 MED ORDER — APIXABAN 2.5 MG PO TABS: 2.5000 mg | ORAL_TABLET | Freq: Two times a day (BID) | ORAL | 0 refills | Status: AC

## 2020-12-03 MED ORDER — FENTANYL CITRATE (PF) 100 MCG/2ML IJ SOLN
INTRAMUSCULAR | Status: DC | PRN
  Administered 2020-12-03: 100 ug via INTRAVENOUS

## 2020-12-03 MED ORDER — DEXAMETHASONE SODIUM PHOSPHATE 4 MG/ML IJ SOLN
INTRAMUSCULAR | Status: DC | PRN
  Administered 2020-12-03: 4 mg via INTRAVENOUS

## 2020-12-03 MED ORDER — PROPOFOL 500 MG/50ML IV EMUL
INTRAVENOUS | Status: DC | PRN
  Administered 2020-12-03: 140 ug/kg/min via INTRAVENOUS

## 2020-12-03 MED ORDER — MIDAZOLAM HCL 5 MG/5ML IJ SOLN
INTRAMUSCULAR | Status: DC | PRN
  Administered 2020-12-03: 2 mg via INTRAVENOUS

## 2020-12-03 MED ORDER — PHENYLEPHRINE HCL (PRESSORS) 10 MG/ML IV SOLN
INTRAVENOUS | Status: DC | PRN
  Administered 2020-12-03 (×2): 100 ug via INTRAVENOUS
  Administered 2020-12-03: 50 ug via INTRAVENOUS
  Administered 2020-12-03: 200 ug via INTRAVENOUS

## 2020-12-03 MED ORDER — BUPIVACAINE LIPOSOME 1.3 % IJ SUSP
INTRAMUSCULAR | Status: DC | PRN
  Administered 2020-12-03: 266 mg

## 2020-12-03 MED ORDER — PROMETHAZINE HCL 25 MG/ML IJ SOLN
6.2500 mg | INTRAMUSCULAR | Status: DC | PRN
  Administered 2020-12-03: 6.25 mg via INTRAVENOUS

## 2020-12-03 MED ORDER — 0.9 % SODIUM CHLORIDE (POUR BTL) OPTIME
TOPICAL | Status: DC | PRN
  Administered 2020-12-03: 500 mL

## 2020-12-03 MED ORDER — GLYCOPYRROLATE 0.2 MG/ML IJ SOLN
INTRAMUSCULAR | Status: DC | PRN
  Administered 2020-12-03: .2 mg via INTRAVENOUS

## 2020-12-03 MED ORDER — POVIDONE-IODINE 7.5 % EX SOLN: Freq: Once | CUTANEOUS | Status: AC

## 2020-12-03 MED ORDER — OXYCODONE HCL 5 MG PO TABS
5.0000 mg | ORAL_TABLET | Freq: Once | ORAL | Status: AC | PRN
Start: 2020-12-03 — End: 2020-12-03
  Administered 2020-12-03: 5 mg via ORAL

## 2020-12-03 MED ORDER — LACTATED RINGERS IV SOLN: INTRAVENOUS | Status: DC

## 2020-12-03 MED ORDER — PROPOFOL 10 MG/ML IV BOLUS
INTRAVENOUS | Status: DC | PRN
  Administered 2020-12-03: 100 mg via INTRAVENOUS

## 2020-12-03 MED ORDER — CEFAZOLIN SODIUM-DEXTROSE 2-4 GM/100ML-% IV SOLN
2.0000 g | INTRAVENOUS | Status: AC
  Administered 2020-12-03: 2 g via INTRAVENOUS

## 2020-12-03 MED ORDER — AMOXICILLIN-POT CLAVULANATE 875-125 MG PO TABS: 1.0000 | ORAL_TABLET | Freq: Two times a day (BID) | ORAL | 0 refills | Status: AC

## 2020-12-03 MED ORDER — DEXMEDETOMIDINE (PRECEDEX) IN NS 20 MCG/5ML (4 MCG/ML) IV SYRINGE
PREFILLED_SYRINGE | INTRAVENOUS | Status: DC | PRN
  Administered 2020-12-03: 10 ug via INTRAVENOUS

## 2020-12-03 MED ORDER — ONDANSETRON HCL 4 MG PO TABS: 4.0000 mg | ORAL_TABLET | Freq: Three times a day (TID) | ORAL | 0 refills | Status: AC | PRN

## 2020-12-03 MED ORDER — LIDOCAINE HCL (CARDIAC) PF 100 MG/5ML IV SOSY
PREFILLED_SYRINGE | INTRAVENOUS | Status: DC | PRN
  Administered 2020-12-03: 30 mg via INTRATRACHEAL

## 2020-12-03 MED ORDER — OXYCODONE-ACETAMINOPHEN 7.5-325 MG PO TABS: 1.0000 | ORAL_TABLET | Freq: Four times a day (QID) | ORAL | 0 refills | Status: AC | PRN

## 2020-12-03 MED ORDER — BUPIVACAINE HCL (PF) 0.5 % IJ SOLN
INTRAMUSCULAR | Status: DC | PRN
  Administered 2020-12-03: 100 mg

## 2020-12-03 SURGICAL SUPPLY — 53 items
BIT DRILL 2 FENESTRATED (MISCELLANEOUS) IMPLANT
BIT DRILL CANNULTD 2.6 X 130MM (DRILL) IMPLANT
BIT DRILL SOLID 2.0 X 110MM (DRILL) IMPLANT
BIT DRILLL 2 FENESTRATED (MISCELLANEOUS) ×2
BLADE OSCILLATING/SAGITTAL (BLADE) ×2
BLADE SW THK.38XMED LNG THN (BLADE) IMPLANT
BNDG CMPR STD VLCR NS LF 5.8X4 (GAUZE/BANDAGES/DRESSINGS) ×1
BNDG CMPR STD VLCR NS LF 5.8X6 (GAUZE/BANDAGES/DRESSINGS) ×1
BNDG ELASTIC 4X5.8 VLCR NS LF (GAUZE/BANDAGES/DRESSINGS) ×2 IMPLANT
BNDG ELASTIC 6X5.8 VLCR NS LF (GAUZE/BANDAGES/DRESSINGS) ×2 IMPLANT
BNDG ESMARK 4X12 TAN STRL LF (GAUZE/BANDAGES/DRESSINGS) ×2 IMPLANT
BNDG GAUZE ELAST 4 BULKY (GAUZE/BANDAGES/DRESSINGS) ×2 IMPLANT
CANISTER SUCT 1200ML W/VALVE (MISCELLANEOUS) ×2 IMPLANT
COUNTERSICK 4.0 HEADED (MISCELLANEOUS) ×2
COVER LIGHT HANDLE UNIVERSAL (MISCELLANEOUS) ×4 IMPLANT
CUFF TOURN SGL QUICK 18X4 (TOURNIQUET CUFF) ×1 IMPLANT
DRAPE FLUOR MINI C-ARM 54X84 (DRAPES) ×2 IMPLANT
DRILL CANNULATED 2.6 X 130MM (DRILL) ×2
DRILL SOLID 2.0 X 110MM (DRILL) ×2
DURAPREP 26ML APPLICATOR (WOUND CARE) ×2 IMPLANT
ELECT REM PT RETURN 9FT ADLT (ELECTROSURGICAL) ×2
ELECTRODE REM PT RTRN 9FT ADLT (ELECTROSURGICAL) ×1 IMPLANT
GAUZE SPONGE 4X4 12PLY STRL (GAUZE/BANDAGES/DRESSINGS) ×2 IMPLANT
GAUZE XEROFORM 1X8 LF (GAUZE/BANDAGES/DRESSINGS) ×2 IMPLANT
GLOVE SURG ENC MOIS LTX SZ7 (GLOVE) ×2 IMPLANT
GLOVE SURG POLYISO LF SZ7 (GLOVE) ×2 IMPLANT
GOWN STRL REUS W/ TWL LRG LVL3 (GOWN DISPOSABLE) ×2 IMPLANT
GOWN STRL REUS W/TWL LRG LVL3 (GOWN DISPOSABLE) ×4
K-WIRE SMOOTH TROCAR 2.0X150 (WIRE) ×2
K-WIRE SNGL END 1.2X150 (MISCELLANEOUS) ×2
KIT TURNOVER KIT A (KITS) ×2 IMPLANT
KWIRE SMOOTH TROCAR 2.0X150 (WIRE) IMPLANT
KWIRE SNGL END 1.2X150 (MISCELLANEOUS) IMPLANT
NS IRRIG 500ML POUR BTL (IV SOLUTION) ×2 IMPLANT
PACK EXTREMITY ARMC (MISCELLANEOUS) ×2 IMPLANT
PADDING CAST BLEND 4X4 NS (MISCELLANEOUS) ×8 IMPLANT
PLATE LAPIDUS 3H STD RT (Plate) ×1 IMPLANT
SCREW 2.7 NON LOCK (Screw) ×1 IMPLANT
SCREW 4.0X24 ST (Screw) ×1 IMPLANT
SCREW 4.0X44 SHORT THREAD (Screw) ×1 IMPLANT
SCREW COUNTERSINK 4.0 HEADED (MISCELLANEOUS) IMPLANT
SCREW LOCK PLATE R3 2.7X14 (Screw) ×2 IMPLANT
SCREW LOCK PLATE R3 2.7X17 (Screw) ×1 IMPLANT
SCREW LOCK PLATE R3 3.5X14 (Screw) ×1 IMPLANT
SPLINT CAST 1 STEP 4X30 (MISCELLANEOUS) ×2 IMPLANT
STOCKINETTE STRL 6IN 960660 (GAUZE/BANDAGES/DRESSINGS) ×2 IMPLANT
SUT MNCRL 4-0 (SUTURE) ×2
SUT MNCRL 4-0 27XMFL (SUTURE) ×1
SUT VIC AB 3-0 SH 27 (SUTURE) ×4
SUT VIC AB 3-0 SH 27X BRD (SUTURE) IMPLANT
SUT VIC AB 4-0 FS2 27 (SUTURE) ×1 IMPLANT
SUTURE MNCRL 4-0 27XMF (SUTURE) IMPLANT
WIRE OLIVE SMOOTH 1.4MMX60MM (WIRE) ×1 IMPLANT

## 2020-12-03 NOTE — Transfer of Care (Signed)
Immediate Anesthesia Transfer of Care Note  Patient: Mary [name]  Procedure(s) Performed: Dorena Dew (Right: Toe)  Patient Location: PACU  Anesthesia Type: General, Regional  Level of Consciousness: awake, alert  and patient cooperative  Airway and Oxygen Therapy: Patient Spontanous Breathing and Patient connected to supplemental oxygen  Post-op Assessment: Post-op Vital signs reviewed, Patient's Cardiovascular Status Stable, Respiratory Function Stable, Patent Airway and No signs of Nausea or vomiting  Post-op Vital Signs: Reviewed and stable  Complications: No notable events documented.

## 2020-12-03 NOTE — Progress Notes (Signed)
Assisted Dr. Molli Hazard  with right, ultrasound guided, popliteal/saphenous block. Side rails up, monitors on throughout procedure. See vital signs in flow sheet. Tolerated Procedure well.

## 2020-12-03 NOTE — Anesthesia Preprocedure Evaluation (Signed)
Anesthesia Evaluation  Patient identified by MRN, date of birth, ID band Patient awake    Reviewed: Allergy & Precautions, H&P , NPO status , Patient's Chart, lab work & pertinent test results, reviewed documented beta blocker date and time   Airway Mallampati: II  TM Distance: >3 FB Neck ROM: full    Dental no notable dental hx.    Pulmonary neg pulmonary ROS, Patient abstained from smoking., former smoker,    Pulmonary exam normal breath sounds clear to auscultation       Cardiovascular Exercise Tolerance: Good hypertension, negative cardio ROS   Rhythm:regular Rate:Normal     Neuro/Psych negative neurological ROS  negative psych ROS   GI/Hepatic negative GI ROS, Neg liver ROS,   Endo/Other  negative endocrine ROS  Renal/GU negative Renal ROS  negative genitourinary   Musculoskeletal   Abdominal   Peds  Hematology negative hematology ROS (+)   Anesthesia Other Findings   Reproductive/Obstetrics negative OB ROS                             Anesthesia Physical Anesthesia Plan  ASA: 2  Anesthesia Plan: General and Regional   Post-op Pain Management:    Induction:   PONV Risk Score and Plan: 3 and Treatment may vary due to age or medical condition, Propofol infusion and Ondansetron  Airway Management Planned:   Additional Equipment:   Intra-op Plan:   Post-operative Plan:   Informed Consent: I have reviewed the patients History and Physical, chart, labs and discussed the procedure including the risks, benefits and alternatives for the proposed anesthesia with the patient or authorized representative who has indicated his/her understanding and acceptance.     Dental Advisory Given  Plan Discussed with: CRNA  Anesthesia Plan Comments:         Anesthesia Quick Evaluation

## 2020-12-03 NOTE — Anesthesia Procedure Notes (Addendum)
Anesthesia Regional Block: Adductor canal block   Pre-Anesthetic Checklist: , timeout performed,  Correct Patient, Correct Site, Correct Laterality,  Correct Procedure, Correct Position, site marked,  Risks and benefits discussed,  Surgical consent,  Pre-op evaluation,  At surgeon's request and post-op pain management  Laterality: Right  Prep: chloraprep       Needles:  Injection technique: Single-shot  Needle Type: Stimiplex      Needle Gauge: 20     Additional Needles:   Procedures:,,,, ultrasound used (permanent image in chart),,    Narrative:  End time: 12/03/2020 7:12 AM Injection made incrementally with aspirations every 5 mL.  Performed by: Personally  Anesthesiologist: Loni Beckwith, MD

## 2020-12-03 NOTE — Anesthesia Procedure Notes (Addendum)
Anesthesia Regional Block: Popliteal block   Pre-Anesthetic Checklist: , timeout performed,  Correct Patient, Correct Site, Correct Laterality,  Correct Procedure, Correct Position, site marked,  Risks and benefits discussed,  Surgical consent,  Pre-op evaluation,  At surgeon's request and post-op pain management  Laterality: Right  Prep: chloraprep       Needles:  Injection technique: Single-shot  Needle Type: Stimiplex      Needle Gauge: 20     Additional Needles:   Procedures:,,,, ultrasound used (permanent image in chart),,    Narrative:  End time: 12/03/2020 7:08 AM Injection made incrementally with aspirations every 5 mL.  Performed by: Personally  Anesthesiologist: Loni Beckwith, MD

## 2020-12-03 NOTE — Anesthesia Procedure Notes (Signed)
Procedure Name: General with mask airway Date/Time: 12/03/2020 7:47 AM Performed by: Jinny Blossom, CRNA Pre-anesthesia Checklist: Patient identified, Emergency Drugs available, Suction available, Patient being monitored and Timeout performed Patient Re-evaluated:Patient Re-evaluated prior to induction Oxygen Delivery Method: Simple face mask Preoxygenation: Pre-oxygenation with 100% oxygen Induction Type: IV induction Placement Confirmation: positive ETCO2

## 2020-12-03 NOTE — H&P (Signed)
HISTORY AND PHYSICAL INTERVAL NOTE:  12/03/2020  7:15 AM  Mary [name]  has presented today for surgery, with the diagnosis of M20.1- Hallux valgus.  The various methods of treatment have been discussed with the patient.  No guarantees were given.  After consideration of risks, benefits and other options for treatment, the patient has consented to surgery.  I have reviewed the patients' chart and labs.    PROCEDURE: RIGHT LAPIDUS BUNIONECTOMY WITH AKIN OSTEOTOMY   A history and physical examination was performed in my office.  The patient was reexamined.  There have been no changes to this history and physical examination.  [name], DPM

## 2020-12-03 NOTE — Op Note (Signed)
PODIATRY / FOOT AND ANKLE SURGERY OPERATIVE REPORT    SURGEON: Caroline More, DPM  PRE-OPERATIVE DIAGNOSIS:  1.  Hallux valgus right 2.  Hypermobility first tarsometatarsal joint right  POST-OPERATIVE DIAGNOSIS: Same  PROCEDURE(S): Right Lapidus bunionectomy  HEMOSTASIS: Right ankle tourniquet  ANESTHESIA: MAC  ESTIMATED BLOOD LOSS: 10 cc  FINDING(S): 1.  Hypermobility at the first tarsometatarsal joint with large bunion deformity right side.  PATHOLOGY/SPECIMEN(S): None  INDICATIONS:   Mary [name] is a 50 y.o. female who presents with painful bunions to both feet right greater than left.  Patient has exhausted conservative measures consisting of change in shoe gear, orthoses, padding, strapping, taping, anti-inflammatory medications but still continues to have pain discomfort.  Discussed all treatment options with the patient both conservative and surgical attempts at correction including potential risks and complications at this time patient is elected for surgical procedure consisting of right Lapidus bunionectomy with possible Akin osteotomy.  No guarantees given.  Consent obtained prior to procedure.  DESCRIPTION: After obtaining full informed written consent, the patient was brought back to the operating room and placed supine upon the operating table.  The patient received IV antibiotics prior to induction.  After obtaining adequate anesthesia, the patient was prepped and draped in the standard fashion.  An Esmarch bandage was used to exsanguinate the right lower extremity and pneumatic ankle tourniquet was inflated.  Preoperatively anesthesia performed a popliteal and saphenous nerve block.  Attention was directed to the dorsal medial aspect of the first tarsometatarsal joint and first metatarsal phalangeal joint where a linear longitudinal incision was made in this area medial to the tendon of the extensor hallucis longus.  The incision was deepened to the subcutaneous  tissue utilizing sharp and blunt dissection and care was taken to identify and retract all vital neurovascular structures and all venous contributories were cauterized as necessary.  At this time a capsular and periosteal incision was made medial to the tendon of the extensor hallucis longus at the level of the first tarsometatarsal joint and first metatarsal phalangeal joint.  The periosteal and capsular tissue was reflected medially and laterally thereby exposing the first tarsometatarsal joint as well as the first metatarsal phalangeal joint.  Blunt dissection was continued while retracting the skin laterally and extensor hallucis longus tendon medially at the level of the lateral first metatarsal phalangeal joint down to the lateral joint capsule.  At this time a lateral release was performed releasing the collateral and suspensory ligaments as well as the conjoined tendon of the adductor hallucis.  The hallux abductus angle appeared to be improved overall as well as the contracture present at the first metatarsal phalangeal joint.  Attention was then directed to the medial aspect of the first metatarsal head where the prominence that was noted at the medial eminence was resected and passed off from the operative site and smoothed off to a normal contour.  Attention was then directed to the first tarsometatarsal joint area where the ligamentous structures were disrupted with an osteotome and sagittal bone saw which was placed within the joint.  At this time the sagittal bone saw was used to resect the cartilaginous portion of the distal aspect of the medial cuneiform removing more laterally than medially creating a wedge.  Hand curette and osteotome resection was performed of the first metatarsal base removing all articular cartilage present.  The surgical site was flushed with copious amounts normal sterile saline.  Joint fenestration was then performed with a 2.0 drill bit.  At  this time a joystick wire was  placed into the medial aspect of the first metatarsal base.  The first metatarsal was then manipulated into the appropriate position reducing the intermetatarsal angle as well as the rotation first metatarsal head while also dorsiflexing the hallux in maximal position to plantarflexed first ray.  All this was being performed and holding with manual compression a temporary wire was placed across the first tarsometatarsal joint and a secondary wire was also placed for temporary fixation.  C-arm imaging was utilized to verify positioning of the first metatarsal which appeared to be excellent.  The first intermetatarsal space angle appeared to be reduced to within normal limits and the rotation of the first metatarsal appeared to be appropriate such that the sesamoids were directly underneath the first metatarsal head.  The hallux abductus angle also appeared to be well reduced.  At this time another wire was placed across the first metatarsal base area across the first tarsometatarsal joint and into the medial cuneiform from dorsal distal to plantar proximal.  C-arm imaging was utilized to verify correct position.  At this time utilizing standard AO principles and techniques a 4.0 x 61m partially-threaded Paragon 28 screw was placed across the first tarsometatarsal joint with excellent compression noted.  C-arm imaging was utilized to verify screw length and correction which again appeared to be excellent.  At this time the temporary wire was removed and one of the temporary fixation wires was also removed keeping 1 in place.  At this time the Paragon 28 medial Lapidus plate was then placed at the medial aspect of the joint such that 2 holes were in the first metatarsal and tools were in the medial cuneiform area.  Utilizing standard AO principles and techniques to one 2.7 and one 3.5 locking screw was placed into the proximal holes.  One 2.7 locking screw was placed into the distal hole and then the most distal hole  had a 2.7 nonlocking screw in place.  The plate appeared to sit excellently on first metatarsal overall.  There is noted to still be some instability at the intercuneiform joint level so at this time it was determined to put intercuneiform fixation as well so a guidewire was placed across the medial cuneiform into the intermediate cuneiform at the appropriate orientation under fluoroscopic guidance.  Once this was performed utilizing standard AO principles and techniques a 4.0 x 24 mm partially-threaded Paragon 28 screw was placed across the first intercuneiform joint with excellent compression.  The screw appeared to be of the appropriate length and size.  Stressing was then performed again of the intercuneiform level which appeared to be adequately fixated at this time.  The surgical sites were flushed with copious amounts normal sterile saline.  At this time a medial capsulorrhaphy was performed tightening up the medial capsule while holding the toe in a rectus position with 3-0 Vicryl.  Final C-arm imaging was then taken showing correction of the hallux abductus angle as well as first intermetatarsal space angle, the sesamoids appear to be rotated in the frontal plane such there underneath the first metatarsal head.  Minimal shortening occurred of the first metatarsal as well.  The first metatarsal appeared to be in alignment with the rest of the foot on the lateral view as well.  The hardware appeared to be of the appropriate length and size overall.  The surgical site was once again flushed with copious amounts normal sterile saline.  The periosteal and capsular structures were then reapproximated well  coapted with 3-0 Vicryl.  The subcutaneous tissue was reapproximated well coapted with 4-0 Vicryl.  The skin was then reapproximated well coapted with subcuticular 4-0 Monocryl stitching with Mastisol and Steri-Strips.  A postoperative dressing was then applied consisting of Xeroform to the incision line  followed by 4 x 4 gauze, Kling, Kerlix, web roll, posterior splint, Ace wrap.  The pneumatic ankle tourniquet was deflated and a prompt hyperemic response was noted all digits right foot.  The patient tolerated the procedure and anesthesia well was transferred to recovery room with vital signs stable vascular status intact all toes of the right foot.  Patient be discharged home with the appropriate orders and instructions as well as medications.  Patient is to remain nonweightbearing on the right foot at all times.  COMPLICATIONS: None  CONDITION: Good, stable  Caroline More, DPM

## 2020-12-03 NOTE — Anesthesia Postprocedure Evaluation (Signed)
Anesthesia Post Note  Patient: Mary [name]  Procedure(s) Performed: Mary [name] (Right: Foot)     Patient location during evaluation: PACU Anesthesia Type: Regional Level of consciousness: awake and alert Pain management: pain level controlled Vital Signs Assessment: post-procedure vital signs reviewed and stable Respiratory status: spontaneous breathing, nonlabored ventilation and respiratory function stable Cardiovascular status: blood pressure returned to baseline and stable Postop Assessment: no apparent nausea or vomiting Anesthetic complications: no   No notable events documented.  Loni Beckwith

## 2020-12-04 ENCOUNTER — Encounter: Payer: Self-pay | Admitting: Podiatry

## 2021-06-16 ENCOUNTER — Other Ambulatory Visit: Payer: Self-pay | Admitting: Infectious Diseases

## 2021-06-16 DIAGNOSIS — Z1231 Encounter for screening mammogram for malignant neoplasm of breast: Secondary | ICD-10-CM

## 2021-06-24 ENCOUNTER — Ambulatory Visit (INDEPENDENT_AMBULATORY_CARE_PROVIDER_SITE_OTHER): Payer: 59

## 2021-06-24 DIAGNOSIS — Z1231 Encounter for screening mammogram for malignant neoplasm of breast: Secondary | ICD-10-CM | POA: Diagnosis not present
